# Patient Record
Sex: Female | Born: 1955 | Hispanic: No | Marital: Single
Health system: Midwestern US, Community
[De-identification: ages and names within clinical notes are randomized; demographics above are authoritative.]

## PROBLEM LIST (undated history)

## (undated) DIAGNOSIS — G894 Chronic pain syndrome: Secondary | ICD-10-CM

## (undated) DIAGNOSIS — T402X5A Adverse effect of other opioids, initial encounter: Secondary | ICD-10-CM

## (undated) DIAGNOSIS — I10 Essential (primary) hypertension: Secondary | ICD-10-CM

## (undated) DIAGNOSIS — E118 Type 2 diabetes mellitus with unspecified complications: Principal | ICD-10-CM

## (undated) DIAGNOSIS — K5903 Drug induced constipation: Secondary | ICD-10-CM

## (undated) DIAGNOSIS — M25551 Pain in right hip: Secondary | ICD-10-CM

## (undated) DIAGNOSIS — M25552 Pain in left hip: Secondary | ICD-10-CM

## (undated) DIAGNOSIS — D509 Iron deficiency anemia, unspecified: Secondary | ICD-10-CM

## (undated) DIAGNOSIS — E782 Mixed hyperlipidemia: Secondary | ICD-10-CM

## (undated) DIAGNOSIS — Z1231 Encounter for screening mammogram for malignant neoplasm of breast: Secondary | ICD-10-CM

## (undated) DIAGNOSIS — M160 Bilateral primary osteoarthritis of hip: Secondary | ICD-10-CM

## (undated) DIAGNOSIS — M1990 Unspecified osteoarthritis, unspecified site: Secondary | ICD-10-CM

## (undated) DIAGNOSIS — F4312 Post-traumatic stress disorder, chronic: Secondary | ICD-10-CM

## (undated) DIAGNOSIS — E1165 Type 2 diabetes mellitus with hyperglycemia: Secondary | ICD-10-CM

## (undated) HISTORY — PX: SKIN GRAFT: SHX250

## (undated) HISTORY — PX: OTHER SURGICAL HISTORY: SHX169

## (undated) HISTORY — PX: TUBAL LIGATION: SHX77

## (undated) HISTORY — PX: KNEE SURGERY: SHX244

---

## 2014-09-19 DIAGNOSIS — Z96653 Presence of artificial knee joint, bilateral: Secondary | ICD-10-CM | POA: Insufficient documentation

## 2014-09-19 DIAGNOSIS — I890 Lymphedema, not elsewhere classified: Secondary | ICD-10-CM | POA: Insufficient documentation

## 2014-09-19 DIAGNOSIS — F4312 Post-traumatic stress disorder, chronic: Secondary | ICD-10-CM

## 2014-09-19 HISTORY — DX: Post-traumatic stress disorder, chronic: F43.12

## 2015-01-05 DIAGNOSIS — E119 Type 2 diabetes mellitus without complications: Secondary | ICD-10-CM | POA: Diagnosis not present

## 2015-01-05 DIAGNOSIS — I10 Essential (primary) hypertension: Secondary | ICD-10-CM | POA: Diagnosis not present

## 2015-02-02 DIAGNOSIS — J309 Allergic rhinitis, unspecified: Secondary | ICD-10-CM | POA: Diagnosis not present

## 2015-02-02 DIAGNOSIS — H543 Unqualified visual loss, both eyes: Secondary | ICD-10-CM | POA: Diagnosis not present

## 2015-02-02 DIAGNOSIS — I10 Essential (primary) hypertension: Secondary | ICD-10-CM | POA: Diagnosis not present

## 2015-02-02 DIAGNOSIS — E119 Type 2 diabetes mellitus without complications: Secondary | ICD-10-CM | POA: Diagnosis not present

## 2015-04-06 DIAGNOSIS — I10 Essential (primary) hypertension: Secondary | ICD-10-CM | POA: Diagnosis not present

## 2015-04-06 DIAGNOSIS — N959 Unspecified menopausal and perimenopausal disorder: Secondary | ICD-10-CM | POA: Diagnosis not present

## 2015-04-06 DIAGNOSIS — L2089 Other atopic dermatitis: Secondary | ICD-10-CM | POA: Diagnosis not present

## 2015-04-06 DIAGNOSIS — IMO0002 Reserved for concepts with insufficient information to code with codable children: Secondary | ICD-10-CM

## 2015-04-06 DIAGNOSIS — E1165 Type 2 diabetes mellitus with hyperglycemia: Secondary | ICD-10-CM

## 2015-04-06 DIAGNOSIS — H543 Unqualified visual loss, both eyes: Secondary | ICD-10-CM | POA: Diagnosis not present

## 2015-04-06 DIAGNOSIS — I890 Lymphedema, not elsewhere classified: Secondary | ICD-10-CM | POA: Diagnosis not present

## 2015-04-06 DIAGNOSIS — J309 Allergic rhinitis, unspecified: Secondary | ICD-10-CM | POA: Diagnosis not present

## 2015-04-06 DIAGNOSIS — Z6841 Body Mass Index (BMI) 40.0 and over, adult: Secondary | ICD-10-CM | POA: Diagnosis not present

## 2015-04-06 DIAGNOSIS — Z1231 Encounter for screening mammogram for malignant neoplasm of breast: Secondary | ICD-10-CM | POA: Diagnosis not present

## 2015-04-06 DIAGNOSIS — R05 Cough: Secondary | ICD-10-CM | POA: Diagnosis not present

## 2015-04-06 DIAGNOSIS — Z1239 Encounter for other screening for malignant neoplasm of breast: Secondary | ICD-10-CM | POA: Diagnosis not present

## 2015-04-06 DIAGNOSIS — F418 Other specified anxiety disorders: Secondary | ICD-10-CM | POA: Diagnosis not present

## 2015-04-06 HISTORY — DX: Reserved for concepts with insufficient information to code with codable children: IMO0002

## 2015-04-06 HISTORY — DX: Type 2 diabetes mellitus with hyperglycemia: E11.65

## 2015-05-11 DIAGNOSIS — Z1231 Encounter for screening mammogram for malignant neoplasm of breast: Secondary | ICD-10-CM | POA: Diagnosis not present

## 2015-05-11 DIAGNOSIS — Z1239 Encounter for other screening for malignant neoplasm of breast: Secondary | ICD-10-CM | POA: Diagnosis not present

## 2015-06-29 DIAGNOSIS — E119 Type 2 diabetes mellitus without complications: Secondary | ICD-10-CM | POA: Diagnosis not present

## 2015-09-15 DIAGNOSIS — Z6841 Body Mass Index (BMI) 40.0 and over, adult: Secondary | ICD-10-CM | POA: Diagnosis not present

## 2015-09-15 DIAGNOSIS — R200 Anesthesia of skin: Secondary | ICD-10-CM | POA: Diagnosis not present

## 2015-09-15 DIAGNOSIS — R05 Cough: Secondary | ICD-10-CM | POA: Diagnosis not present

## 2015-09-15 DIAGNOSIS — Z23 Encounter for immunization: Secondary | ICD-10-CM | POA: Diagnosis not present

## 2015-09-15 DIAGNOSIS — R202 Paresthesia of skin: Secondary | ICD-10-CM | POA: Diagnosis not present

## 2015-09-15 DIAGNOSIS — J0100 Acute maxillary sinusitis, unspecified: Secondary | ICD-10-CM | POA: Diagnosis not present

## 2015-09-15 DIAGNOSIS — E1165 Type 2 diabetes mellitus with hyperglycemia: Secondary | ICD-10-CM | POA: Diagnosis not present

## 2015-09-18 DIAGNOSIS — D509 Iron deficiency anemia, unspecified: Secondary | ICD-10-CM

## 2015-09-18 DIAGNOSIS — E1165 Type 2 diabetes mellitus with hyperglycemia: Secondary | ICD-10-CM | POA: Diagnosis not present

## 2015-09-18 DIAGNOSIS — R200 Anesthesia of skin: Secondary | ICD-10-CM | POA: Diagnosis not present

## 2015-09-18 DIAGNOSIS — R202 Paresthesia of skin: Secondary | ICD-10-CM | POA: Diagnosis not present

## 2015-09-18 HISTORY — DX: Iron deficiency anemia, unspecified: D50.9

## 2015-10-19 DIAGNOSIS — D509 Iron deficiency anemia, unspecified: Secondary | ICD-10-CM | POA: Diagnosis not present

## 2015-10-19 DIAGNOSIS — N959 Unspecified menopausal and perimenopausal disorder: Secondary | ICD-10-CM | POA: Diagnosis not present

## 2015-12-26 DIAGNOSIS — E119 Type 2 diabetes mellitus without complications: Secondary | ICD-10-CM | POA: Diagnosis not present

## 2015-12-26 DIAGNOSIS — D259 Leiomyoma of uterus, unspecified: Secondary | ICD-10-CM | POA: Diagnosis not present

## 2015-12-26 DIAGNOSIS — I10 Essential (primary) hypertension: Secondary | ICD-10-CM | POA: Diagnosis not present

## 2015-12-26 DIAGNOSIS — R61 Generalized hyperhidrosis: Secondary | ICD-10-CM | POA: Diagnosis not present

## 2015-12-26 DIAGNOSIS — N939 Abnormal uterine and vaginal bleeding, unspecified: Secondary | ICD-10-CM | POA: Diagnosis not present

## 2015-12-26 DIAGNOSIS — Z7984 Long term (current) use of oral hypoglycemic drugs: Secondary | ICD-10-CM | POA: Diagnosis not present

## 2015-12-26 DIAGNOSIS — Z6841 Body Mass Index (BMI) 40.0 and over, adult: Secondary | ICD-10-CM | POA: Diagnosis not present

## 2015-12-26 DIAGNOSIS — Z79899 Other long term (current) drug therapy: Secondary | ICD-10-CM | POA: Diagnosis not present

## 2015-12-26 DIAGNOSIS — N840 Polyp of corpus uteri: Secondary | ICD-10-CM | POA: Diagnosis not present

## 2015-12-26 DIAGNOSIS — E669 Obesity, unspecified: Secondary | ICD-10-CM | POA: Diagnosis not present

## 2016-02-19 DIAGNOSIS — G8929 Other chronic pain: Secondary | ICD-10-CM | POA: Diagnosis not present

## 2016-02-19 DIAGNOSIS — M545 Low back pain: Secondary | ICD-10-CM | POA: Diagnosis not present

## 2016-02-19 DIAGNOSIS — E119 Type 2 diabetes mellitus without complications: Secondary | ICD-10-CM | POA: Diagnosis not present

## 2016-04-18 DIAGNOSIS — D250 Submucous leiomyoma of uterus: Secondary | ICD-10-CM | POA: Diagnosis not present

## 2016-04-18 DIAGNOSIS — Z1211 Encounter for screening for malignant neoplasm of colon: Secondary | ICD-10-CM | POA: Diagnosis not present

## 2016-04-18 DIAGNOSIS — M25552 Pain in left hip: Secondary | ICD-10-CM | POA: Diagnosis not present

## 2016-04-18 DIAGNOSIS — Z6841 Body Mass Index (BMI) 40.0 and over, adult: Secondary | ICD-10-CM | POA: Diagnosis not present

## 2016-04-18 DIAGNOSIS — E1165 Type 2 diabetes mellitus with hyperglycemia: Secondary | ICD-10-CM | POA: Diagnosis not present

## 2016-04-18 DIAGNOSIS — D509 Iron deficiency anemia, unspecified: Secondary | ICD-10-CM | POA: Diagnosis not present

## 2016-04-18 DIAGNOSIS — Z23 Encounter for immunization: Secondary | ICD-10-CM | POA: Diagnosis not present

## 2016-04-18 DIAGNOSIS — I10 Essential (primary) hypertension: Secondary | ICD-10-CM | POA: Diagnosis not present

## 2016-04-18 DIAGNOSIS — Z09 Encounter for follow-up examination after completed treatment for conditions other than malignant neoplasm: Secondary | ICD-10-CM | POA: Diagnosis not present

## 2016-04-18 DIAGNOSIS — I890 Lymphedema, not elsewhere classified: Secondary | ICD-10-CM | POA: Diagnosis not present

## 2016-04-18 DIAGNOSIS — R05 Cough: Secondary | ICD-10-CM | POA: Diagnosis not present

## 2016-05-23 ENCOUNTER — Ambulatory Visit (HOSPITAL_COMMUNITY)
Admission: EM | Admit: 2016-05-23 | Discharge: 2016-05-23 | Disposition: A | Discharge status: Home / Self Care | Payer: Medicare Other | Attending: Family Medicine | Admitting: Family Medicine

## 2016-05-23 ENCOUNTER — Encounter (HOSPITAL_COMMUNITY): Payer: Self-pay | Admitting: *Deleted

## 2016-05-23 DIAGNOSIS — M25552 Pain in left hip: Secondary | ICD-10-CM

## 2016-05-23 HISTORY — DX: Essential (primary) hypertension: I10

## 2016-05-23 HISTORY — DX: Unspecified osteoarthritis, unspecified site: M19.90

## 2016-05-23 MED ORDER — NAPROXEN 500 MG PO TABS: 500.0000 mg | ORAL_TABLET | Freq: Two times a day (BID) | ORAL | Status: DC

## 2016-05-23 NOTE — ED Provider Notes (Signed)
CSN: [id number]     Arrival date & time 05/23/16  1014 History   First MD Initiated Contact with Patient 05/23/16 1106     Chief Complaint  Patient presents with  . Hip Pain   (Consider location/radiation/quality/duration/timing/severity/associated sxs/prior Treatment) HPI  Past Medical History  Diagnosis Date  . Diabetes mellitus without complication ([place])   . Hypertension   . Arthritis    Past Surgical History  Procedure Laterality Date  . Knee surgery    . Btl    . Skin graft     Family History  Problem Relation Age of Onset  . Diabetes Other    Social History  Substance Use Topics  . Smoking status: Never Smoker   . Smokeless tobacco: None  . Alcohol Use: No   OB History    No data available     Review of Systems History obtained from patient:  Pt presents with the cc of:  Left hip pain Duration of symptoms: 3-4 days Treatment prior to arrival: Naprosyn Context: Patient has been moving from [facility] to [place] and is been doing a lot of walking and moving items now has hip pain Other symptoms include: Some pain radiating down her leg Pain score: 4 FAMILY HISTORY: Hypertension    Allergies  Morphine and related  Home Medications   Prior to Admission medications   Medication Sig Start Date End Date Taking? Authorizing Provider  naproxen (NAPROSYN) 500 MG tablet Take 1 tablet (500 mg total) by mouth 2 (two) times daily. 05/23/16   Konrad Felix, PA   Meds Ordered and Administered this Visit  Medications - No data to display  BP 139/84 mmHg  Pulse 78  Temp(Src) 98.6 F (37 C) (Oral)  Resp 18  SpO2 100% No data found.   Physical Exam NURSES NOTES AND VITAL SIGNS REVIEWED. CONSTITUTIONAL: Well developed, well nourished, no acute distress HEENT: normocephalic, atraumatic EYES: Conjunctiva normal NECK:normal ROM, supple, no adenopathy PULMONARY:No respiratory distress, normal effort ABDOMINAL: Soft, ND, NT BS+, No CVAT MUSCULOSKELETAL: Normal  ROM of all extremities, left hip and groin minimally tender. There is no specific point tenderness in the area. Flexion and extension of the right hip is intact.  SKIN: warm and dry without rash PSYCHIATRIC: Mood and affect, behavior are normal  ED Course  Procedures (including critical care time)  Labs Review Labs Reviewed - No data to display  Imaging Review No results found.   Visual Acuity Review  Right Eye Distance:   Left Eye Distance:   Bilateral Distance:    Right Eye Near:   Left Eye Near:    Bilateral Near:      Prescription for Naprosyn is provided to the patient.fcpl    MDM   1. Hip pain, acute, left     Patient is reassured that there are no issues that require transfer to higher level of care at this time or additional tests. Patient is advised to continue home symptomatic treatment. Patient is advised that if there are new or worsening symptoms to attend the emergency department, contact primary care provider, or return to UC. Instructions of care provided discharged home in stable condition.    THIS NOTE WAS GENERATED USING A VOICE RECOGNITION SOFTWARE PROGRAM. ALL REASONABLE EFFORTS  WERE MADE TO PROOFREAD THIS DOCUMENT FOR ACCURACY.  I have verbally reviewed the discharge instructions with the patient. A printed AVS was given to the patient.  All questions were answered prior to discharge.      Pilar Plate  Dillard Essex, [place] 05/23/16 1731

## 2016-05-23 NOTE — Discharge Instructions (Signed)
Heat Therapy Heat therapy can help ease sore, stiff, injured, and tight muscles and joints. Heat relaxes your muscles, which may help ease your pain.  RISKS AND COMPLICATIONS If you have any of the following conditions, do not use heat therapy unless your health care provider has approved:  Poor circulation.  Healing wounds or scarred skin in the area being treated.  Diabetes, heart disease, or high blood pressure.  Not being able to feel (numbness) the area being treated.  Unusual swelling of the area being treated.  Active infections.  Blood clots.  Cancer.  Inability to communicate pain. This may include young children and people who have problems with their brain function (dementia).  Pregnancy. Heat therapy should only be used on old, pre-existing, or long-lasting (chronic) injuries. Do not use heat therapy on new injuries unless directed by your health care provider. HOW TO USE HEAT THERAPY There are several different kinds of heat therapy, including:  Moist heat pack.  Warm water bath.  Hot water bottle.  Electric heating pad.  Heated gel pack.  Heated wrap.  Electric heating pad. Use the heat therapy method suggested by your health care provider. Follow your health care provider's instructions on when and how to use heat therapy. GENERAL HEAT THERAPY RECOMMENDATIONS  Do not sleep while using heat therapy. Only use heat therapy while you are awake.  Your skin may turn pink while using heat therapy. Do not use heat therapy if your skin turns red.  Do not use heat therapy if you have new pain.  High heat or long exposure to heat can cause burns. Be careful when using heat therapy to avoid burning your skin.  Do not use heat therapy on areas of your skin that are already irritated, such as with a rash or sunburn. SEEK MEDICAL CARE IF:  You have blisters, redness, swelling, or numbness.  You have new pain.  Your pain is worse. MAKE SURE  YOU:  Understand these instructions.  Will watch your condition.  Will get help right away if you are not doing well or get worse.   This information is not intended to replace advice given to you by your health care provider. Make sure you discuss any questions you have with your health care provider.   Document Released: 01/20/2012 Document Revised: 11/18/2014 Document Reviewed: 12/21/2013 Elsevier Interactive Patient Education 2016 Elsevier [employer] Hip Pain Your hip is the joint between your upper legs and your lower pelvis. The bones, cartilage, tendons, and muscles of your hip joint perform a lot of work each day supporting your body weight and allowing you to move around. Hip pain can range from a minor ache to severe pain in one or both of your hips. Pain may be felt on the inside of the hip joint near the groin, or the outside near the buttocks and upper thigh. You may have swelling or stiffness as well.  HOME CARE INSTRUCTIONS   Take medicines only as directed by your health care provider.  Apply ice to the injured area:  Put ice in a plastic bag.  Place a towel between your skin and the bag.  Leave the ice on for 15-20 minutes at a time, 3-4 times a day.  Keep your leg raised (elevated) when possible to lessen swelling.  Avoid activities that cause pain.  Follow specific exercises as directed by your health care provider.  Sleep with a pillow between your legs on your most comfortable side.  Record how often you  have hip pain, the location of the pain, and what it feels like. SEEK MEDICAL CARE IF:   You are unable to put weight on your leg.  Your hip is red or swollen or very tender to touch.  Your pain or swelling continues or worsens after 1 week.  You have increasing difficulty walking.  You have a fever. SEEK IMMEDIATE MEDICAL CARE IF:   You have fallen.  You have a sudden increase in pain and swelling in your hip. MAKE SURE YOU:   Understand these  instructions.  Will watch your condition.  Will get help right away if you are not doing well or get worse.   This information is not intended to replace advice given to you by your health care provider. Make sure you discuss any questions you have with your health care provider.   Document Released: 04/17/2010 Document Revised: 11/18/2014 Document Reviewed: 06/24/2013 Elsevier Interactive Patient Education [employer].

## 2016-05-23 NOTE — ED Notes (Signed)
Pt  Reports pain  l  Hip  radiating  Down  The l  Leg    Since yesterday  She  denys  Any  specefic  Injury    She  Reports  Pain is  Worse  On  cetai  Movements  And  On  Weight  Bearing

## 2016-07-03 ENCOUNTER — Encounter (HOSPITAL_COMMUNITY): Payer: Self-pay | Admitting: Neurology

## 2016-07-03 ENCOUNTER — Emergency Department (HOSPITAL_COMMUNITY): Payer: Medicare Other

## 2016-07-03 ENCOUNTER — Emergency Department (HOSPITAL_COMMUNITY)
Admission: EM | Admit: 2016-07-03 | Discharge: 2016-07-03 | Disposition: A | Discharge status: Home / Self Care | Payer: Medicare Other | Attending: Emergency Medicine | Admitting: Emergency Medicine

## 2016-07-03 DIAGNOSIS — I10 Essential (primary) hypertension: Secondary | ICD-10-CM | POA: Insufficient documentation

## 2016-07-03 DIAGNOSIS — E119 Type 2 diabetes mellitus without complications: Secondary | ICD-10-CM | POA: Diagnosis not present

## 2016-07-03 DIAGNOSIS — M160 Bilateral primary osteoarthritis of hip: Secondary | ICD-10-CM

## 2016-07-03 DIAGNOSIS — M1611 Unilateral primary osteoarthritis, right hip: Secondary | ICD-10-CM | POA: Diagnosis not present

## 2016-07-03 DIAGNOSIS — M1612 Unilateral primary osteoarthritis, left hip: Secondary | ICD-10-CM | POA: Diagnosis not present

## 2016-07-03 DIAGNOSIS — M25551 Pain in right hip: Secondary | ICD-10-CM | POA: Diagnosis not present

## 2016-07-03 MED ORDER — ACETAMINOPHEN 500 MG PO TABS: 500.0000 mg | ORAL_TABLET | Freq: Four times a day (QID) | ORAL | 0 refills | Status: DC | PRN

## 2016-07-03 MED ORDER — NAPROXEN 500 MG PO TABS: 500.0000 mg | ORAL_TABLET | Freq: Two times a day (BID) | ORAL | 0 refills | Status: DC

## 2016-07-03 NOTE — ED Provider Notes (Signed)
[facility] DEPT Provider Note   CSN: [id number] Arrival date & time: 07/03/16  1522  By signing my name below, I, Hayley Alvarado, attest that this documentation has been prepared under the direction and in the presence of Gloriann Loan, PA-C. Electronically Signed: Tedra Coupe. Sheppard Coil, ED Scribe. 07/03/16. 5:19 PM.  History   Chief Complaint Chief Complaint  Patient presents with  . Hip Pain    HPI HPI Comments: Hayley Alvarado is a 60 y.o. female with PMHx of arthritis, DM, and HTN who presents to the Emergency Department complaining of sudden onset, acute on chronic, constant, sharp, radiating right-sided hip pain to right knee x 1 day. She states that she has had left-hip pain in the past but now the right hip is more severe. She denies any recent fall or injury. Pt has taken Naproxen, applying ice packs and warm compresses to hip with no relief. Pain is exacerbated with applying pressure and bearing weight.  Denies any fever, numbness, back pain, or abdominal pain.  The history is provided by the patient. No language interpreter was used.    Past Medical History:  Diagnosis Date  . Arthritis   . Diabetes mellitus without complication ([place])   . Hypertension     There are no active problems to display for this patient.   Past Surgical History:  Procedure Laterality Date  . btl    . KNEE SURGERY    . SKIN GRAFT      OB History    No data available       Home Medications    Prior to Admission medications   Medication Sig Start Date End Date Taking? Authorizing Provider  acetaminophen (TYLENOL) 500 MG tablet Take 1 tablet (500 mg total) by mouth every 6 (six) hours as needed. 07/03/16   Gloriann Loan, PA-C  naproxen (NAPROSYN) 500 MG tablet Take 1 tablet (500 mg total) by mouth 2 (two) times daily. 07/03/16   Gloriann Loan, PA-C    Family History Family History  Problem Relation Age of Onset  . Diabetes Other     Social History Social History  Substance Use  Topics  . Smoking status: Never Smoker  . Smokeless tobacco: Not on file  . Alcohol use No     Allergies   Morphine and related   Review of Systems Review of Systems  Constitutional: Negative for fever.  Gastrointestinal: Negative for abdominal pain.  Musculoskeletal: Positive for arthralgias. Negative for back pain.  Neurological: Negative for numbness.  All other systems reviewed and are negative.  Physical Exm Updated Vital Signs BP 150/80 (BP Location: Left Arm)   Pulse 98   Temp 98.5 F (36.9 C) (Oral)   Resp 20   Wt 113.4 kg   SpO2 96%   Physical Exam  Constitutional: She is oriented to person, place, and time. She appears well-developed and well-nourished.  Non-toxic appearance. She does not have a sickly appearance. She does not appear ill.  HENT:  Head: Normocephalic and atraumatic.  Mouth/Throat: Oropharynx is clear and moist.  Eyes: Conjunctivae are normal.  Neck: Normal range of motion. Neck supple.  Cardiovascular: Normal rate and regular rhythm.   Pulmonary/Chest: Effort normal and breath sounds normal. No accessory muscle usage or stridor. No respiratory distress. She has no wheezes. She has no rhonchi. She has no rales.  Abdominal: Soft. Bowel sounds are normal. She exhibits no distension. There is no tenderness.  Musculoskeletal: She exhibits tenderness.       Right hip:  She exhibits decreased range of motion, decreased strength, tenderness and bony tenderness.       Left hip: Normal.  Diffuse tenderness over right hip.  No swelling, warmth, deformity.  Decreased ROM due to pain.   Lymphadenopathy:    She has no cervical adenopathy.  Neurological: She is alert and oriented to person, place, and time.  Right hip with decreased strength secondary to pain.  Normal sensation.  Skin: Skin is warm and dry.  Psychiatric: She has a normal mood and affect. Her behavior is normal.     ED Treatments / Results  DIAGNOSTIC STUDIES: Oxygen Saturation is 96% on  RA, normal by my interpretation.    COORDINATION OF CARE: 5:18 PM-Discussed treatment plan which includes continuation of Naproxen, order of Extra Strength Tylenol and follow-up with Ortho with pt at bedside and pt agreed to plan.   Radiology Dg Hip Unilat  With Pelvis 2-3 Views Right  Result Date: 07/03/2016 CLINICAL DATA:  Hervey Ard right hip pain radiating to the right thigh since yesterday, no injury EXAM: DG HIP (WITH OR WITHOUT PELVIS) 2-3V RIGHT COMPARISON:  None. FINDINGS: There is age advanced degenerative joint disease involving both hips. There is loss of joint space with sclerosis, spurring, and subchondral cyst formation present. No acute fracture is seen. The pelvic rami are intact. The SI joints are corticated. IMPRESSION: Age advanced degenerative joint disease of both hips. No acute abnormality. Electronically Signed   By: Ivar Drape M.D.   On: 07/03/2016 17:10   Procedures Procedures (including critical care time)   Initial Impression / Assessment and Plan / ED Course  I have reviewed the triage vital signs and the nursing notes.  Pertinent labs & imaging results that were available during my care of the patient were reviewed by me and considered in my medical decision making (see chart for details).  Clinical Course   Patient X-Ray negative for obvious fracture or dislocation, does show advanced degenerative joint disease of b/l hips.  Pt advised to follow up with orthopedics.  Conservative therapy recommended and discussed. Patient will be discharged home & is agreeable with above plan. Returns precautions discussed. Pt appears safe for discharge.   Final Clinical Impressions(s) / ED Diagnoses   Final diagnoses:  Right hip pain  Osteoarthritis of both hips, unspecified osteoarthritis type    New Prescriptions New Prescriptions   ACETAMINOPHEN (TYLENOL) 500 MG TABLET    Take 1 tablet (500 mg total) by mouth every 6 (six) hours as needed.   NAPROXEN (NAPROSYN) 500 MG  TABLET    Take 1 tablet (500 mg total) by mouth 2 (two) times daily.   I personally performed the services described in this documentation, which was scribed in my presence. The recorded information has been reviewed and is accurate.     Gloriann Loan, PA-C 07/03/16 1730    Drenda Freeze, MD 07/06/16 2136

## 2016-07-03 NOTE — ED Notes (Signed)
Pt is in stable condition upon d/c and is escorted from ED via wheelchair. 

## 2016-07-03 NOTE — ED Triage Notes (Signed)
Pt reports right sided hip pain since yesterday. Has been taking naproxen without relief. Denies fall or injury. Pain when bearing wt.

## 2017-01-31 ENCOUNTER — Ambulatory Visit (INDEPENDENT_AMBULATORY_CARE_PROVIDER_SITE_OTHER): Payer: Medicare Other | Admitting: Family Medicine

## 2017-01-31 ENCOUNTER — Encounter: Payer: Self-pay | Admitting: Family Medicine

## 2017-01-31 VITALS — BP 156/80 | HR 111 | Temp 98.9°F | Resp 18 | Ht 64.0 in | Wt 263.0 lb

## 2017-01-31 DIAGNOSIS — I890 Lymphedema, not elsewhere classified: Secondary | ICD-10-CM

## 2017-01-31 DIAGNOSIS — R7309 Other abnormal glucose: Secondary | ICD-10-CM

## 2017-01-31 DIAGNOSIS — M25552 Pain in left hip: Secondary | ICD-10-CM | POA: Diagnosis not present

## 2017-01-31 DIAGNOSIS — I10 Essential (primary) hypertension: Secondary | ICD-10-CM

## 2017-01-31 DIAGNOSIS — Z23 Encounter for immunization: Secondary | ICD-10-CM | POA: Diagnosis not present

## 2017-01-31 DIAGNOSIS — Z862 Personal history of diseases of the blood and blood-forming organs and certain disorders involving the immune mechanism: Secondary | ICD-10-CM

## 2017-01-31 DIAGNOSIS — H539 Unspecified visual disturbance: Secondary | ICD-10-CM

## 2017-01-31 DIAGNOSIS — M25551 Pain in right hip: Secondary | ICD-10-CM

## 2017-01-31 LAB — CBC WITH DIFFERENTIAL/PLATELET
Basophils Absolute: 0 cells/uL (ref 0–200)
Basophils Relative: 0 %
EOS PCT: 2 %
Eosinophils Absolute: 182 cells/uL (ref 15–500)
HCT: 40.4 % (ref 35.0–45.0)
Hemoglobin: 12.7 g/dL (ref 11.7–15.5)
LYMPHS PCT: 28 %
Lymphs Abs: 2548 cells/uL (ref 850–3900)
MCH: 22.8 pg — AB (ref 27.0–33.0)
MCHC: 31.4 g/dL — ABNORMAL LOW (ref 32.0–36.0)
MCV: 72.5 fL — ABNORMAL LOW (ref 80.0–100.0)
MONOS PCT: 7 %
MPV: 11.1 fL (ref 7.5–12.5)
Monocytes Absolute: 637 cells/uL (ref 200–950)
NEUTROS ABS: 5733 {cells}/uL (ref 1500–7800)
Neutrophils Relative %: 63 %
PLATELETS: 264 10*3/uL (ref 140–400)
RBC: 5.57 MIL/uL — ABNORMAL HIGH (ref 3.80–5.10)
RDW: 16.4 % — AB (ref 11.0–15.0)
WBC: 9.1 10*3/uL (ref 3.8–10.8)

## 2017-01-31 LAB — COMPREHENSIVE METABOLIC PANEL
ALT: 10 U/L (ref 6–29)
AST: 13 U/L (ref 10–35)
Albumin: 4.5 g/dL (ref 3.6–5.1)
Alkaline Phosphatase: 97 U/L (ref 33–130)
BUN: 15 mg/dL (ref 7–25)
CO2: 29 mmol/L (ref 20–31)
CREATININE: 0.85 mg/dL (ref 0.50–1.05)
Calcium: 9.3 mg/dL (ref 8.6–10.4)
Chloride: 101 mmol/L (ref 98–110)
GLUCOSE: 120 mg/dL — AB (ref 65–99)
POTASSIUM: 3.7 mmol/L (ref 3.5–5.3)
SODIUM: 139 mmol/L (ref 135–146)
TOTAL PROTEIN: 7.5 g/dL (ref 6.1–8.1)
Total Bilirubin: 0.5 mg/dL (ref 0.2–1.2)

## 2017-01-31 MED ORDER — PREDNISONE 20 MG PO TABS
ORAL_TABLET | ORAL | 0 refills | Status: DC
Start: 2017-01-31 — End: 2018-02-10

## 2017-01-31 MED ORDER — GABAPENTIN 400 MG PO CAPS: 400.0000 mg | ORAL_CAPSULE | Freq: Four times a day (QID) | ORAL | 1 refills | Status: DC

## 2017-01-31 MED ORDER — KETOROLAC TROMETHAMINE 60 MG/2ML IM SOLN
60.0000 mg | Freq: Once | INTRAMUSCULAR | Status: AC
  Administered 2017-01-31: 60 mg via INTRAMUSCULAR

## 2017-01-31 MED ORDER — IPRATROPIUM BROMIDE 0.03 % NA SOLN: 2.0000 | Freq: Two times a day (BID) | NASAL | 1 refills | Status: DC | PRN

## 2017-01-31 MED ORDER — HYDROCHLOROTHIAZIDE 25 MG PO TABS: 25.0000 mg | ORAL_TABLET | Freq: Every day | ORAL | 1 refills | Status: DC

## 2017-01-31 MED ORDER — MELOXICAM 15 MG PO TABS: 15.0000 mg | ORAL_TABLET | Freq: Every day | ORAL | 0 refills | Status: DC

## 2017-01-31 NOTE — Patient Instructions (Signed)
Hip Pain  Take Prednisone 20 mg,  in mornings with breakfast as follows:  Take 3 pills for 3 days, Take 2 pills for 3 days, and Take 1 pill for 3 days.  Complete all medication.  Do not start Meloxicam 15 mg until you complete coarse of prednisone.  Start Gabapentin 400 mg, up to 4 times daily as needed for sciatica pain.   For Post Nasal drip-  Start Cetirizine (Zyrtec) 10 mg at bedtime.  Ipratropium (Atrovent) 2 sprays, twice daily as needed for congestion.

## 2017-01-31 NOTE — Progress Notes (Addendum)
Patient ID: Hayley Alvarado, female    DOB: 1956/03/01, 61 y.o.   MRN: [id number]  PCP: No PCP Per Patient  Chief Complaint  Patient presents with  . Hip Pain    BOTH  . Establish Care  . paper for handicap placard/ SCAT transport    Subjective:  HPI  Hayley Alvarado is a 61 y.o. female presents to establish care, chronic bilateral hip pain, and request forms completed for SCAT and [place] Handicap Placard. Past medical history hypertension, lymphedema, elevated A1C, and chronic hip pain. Recently relocated to [facility] with daughter who moved here to accept a job.  Hip Pain  Gerri recently pain has caused her to become nearly disabled. She has to obtain help from family to complete ADL's. She is not completing homebound, although on some days, she is unable to leave her 2nd floor apartment without assistance.  She characterized hip pain as constant aching with sharp pains occurring with standing and walking. She reports her left hip is worst than right, although both legs are painful. She reports recently experiencing left sharp toe pains. She denies any recent or prior falls. Reports no hx of direct injury to hips or known diagnosis of osteoarthritis.  Prior imaging taken in 06/2016 showed degenerative joint disease. Due to this chronic, disabling pain, patient requests forms completed to aid her with public transportation and a handicap placard.  Visual changes  Two weeks ago Makeya reports acute onset of noticing what appeared to be floaters in his eyes bilaterally. She reports an extended period of time since she has received an eye exam and has noticed recently diminished vision.  Chronic Post Nasal Drip Sensation of dripping in the back of throat persistently for an extended period of time. This occurs irregardless of seasons. It has become annoying and irritating. Denies wheezing or shortness of breath.    Social History   Social History  . Marital status: Single    Spouse  name: N/A  . Number of children: N/A  . Years of education: N/A   Occupational History  . Not on file.   Social History Main Topics  . Smoking status: Never Smoker  . Smokeless tobacco: Never Used  . Alcohol use No  . Drug use: No  . Sexual activity: Not on file   Other Topics Concern  . Not on file   Social History Narrative  . No narrative on file    Family History  Problem Relation Age of Onset  . Diabetes Other      Review of Systems See HPI  Prior to Admission medications   Medication Sig Start Date End Date Taking? Authorizing Provider  acetaminophen (TYLENOL) 500 MG tablet Take 1 tablet (500 mg total) by mouth every 6 (six) hours as needed. 07/03/16  Yes Kayla Rose, PA-C  naproxen (NAPROSYN) 500 MG tablet Take 1 tablet (500 mg total) by mouth 2 (two) times daily. Patient not taking: Reported on 01/31/2017 07/03/16   Gloriann Loan, PA-C    Past Medical, Surgical Family and Social History reviewed and updated.    Objective:   Today's Vitals   01/31/17 1403  BP: (!) 156/80  Pulse: (!) 111  Resp: 18  Temp: 98.9 F (37.2 C)  TempSrc: Oral  SpO2: 98%  Weight: 263 lb (119.3 kg)  Height: 5\' 4"  (1.626 m)    Wt Readings from Last 3 Encounters:  01/31/17 263 lb (119.3 kg)  07/03/16 250 lb (113.4 kg)    Physical Exam  Constitutional:  She is oriented to person, place, and time. She appears well-developed and well-nourished.  HENT:  Head: Normocephalic and atraumatic.  Nose: Rhinorrhea present.  Eyes: Conjunctivae and EOM are normal. Pupils are equal, round, and reactive to light.  Neck: Normal range of motion. Neck supple.  Cardiovascular: Normal rate, regular rhythm, normal heart sounds and intact distal pulses.   Pulmonary/Chest: Effort normal and breath sounds normal.  Musculoskeletal: She exhibits edema, tenderness and deformity.  Grossly edematous non-pitting lower legs. Edema is mostly visible lymphedema with excessively dry and scaly skin patches.   Lymphadenopathy:    She has no cervical adenopathy.  Neurological: She is alert and oriented to person, place, and time.  Skin: Skin is warm and dry.  Psychiatric: She has a normal mood and affect. Her behavior is normal. Judgment and thought content normal.     Assessment & Plan:  1. Bilateral hip pain - ketorolac (TORADOL) injection 60 mg; Inject 2 mLs (60 mg total) into the muscle once. - For home use only DME 4 wheeled rolling walker with seat ([id number]) - For home use only DME Eelevated commode seat - Ambulatory referral to Orthopedic Surgery -Start gabapentin 400 mg four times daily as needed for pain. -Start prednisone taper and take as prescribed. -Take Meloxicam 15 mg once daily for pain after you complete prednisone   2. Essential hypertension - Comprehensive metabolic panel -Continue HCTZ 25 mg daily  3. Need for diphtheria-tetanus-pertussis (Tdap) vaccine - Tdap vaccine greater than or equal to 7yo IM  4. Need for influenza vaccination - Flu Vaccine QUAD 36+ mos IM  5. Visual changes  6. Elevated hemoglobin A1c - Hemoglobin A1c  7. Hx of iron deficiency anemia - CBC with Differential/Platelet  8. Lymphedema -Ambulate legs. Avoid prolong sitting without legs ambulated.  Carroll Sage. Kenton Kingfisher, MSN, [facility] [address] [place], [place] [address] [phone]

## 2017-02-01 LAB — HEMOGLOBIN A1C
HEMOGLOBIN A1C: 7 % — AB (ref <5.7)
MEAN PLASMA GLUCOSE: 154 mg/dL

## 2017-02-03 DIAGNOSIS — M25552 Pain in left hip: Principal | ICD-10-CM

## 2017-02-03 DIAGNOSIS — R7309 Other abnormal glucose: Secondary | ICD-10-CM | POA: Insufficient documentation

## 2017-02-03 DIAGNOSIS — I10 Essential (primary) hypertension: Secondary | ICD-10-CM

## 2017-02-03 DIAGNOSIS — Z862 Personal history of diseases of the blood and blood-forming organs and certain disorders involving the immune mechanism: Secondary | ICD-10-CM | POA: Insufficient documentation

## 2017-02-03 DIAGNOSIS — M25551 Pain in right hip: Secondary | ICD-10-CM | POA: Insufficient documentation

## 2017-02-03 DIAGNOSIS — I890 Lymphedema, not elsewhere classified: Secondary | ICD-10-CM | POA: Insufficient documentation

## 2017-02-03 HISTORY — DX: Essential (primary) hypertension: I10

## 2017-02-04 MED ORDER — METFORMIN HCL 500 MG PO TABS: 500.0000 mg | ORAL_TABLET | Freq: Two times a day (BID) | ORAL | 3 refills | Status: DC

## 2017-02-04 NOTE — Addendum Note (Signed)
Addended by: Scot Jun on: 02/04/2017 10:46 PM   Modules accepted: Orders

## 2017-02-05 ENCOUNTER — Telehealth: Payer: Self-pay | Admitting: Family Medicine

## 2017-02-05 ENCOUNTER — Other Ambulatory Visit: Payer: Self-pay | Admitting: Family Medicine

## 2017-02-05 MED ORDER — ONDANSETRON HCL 4 MG PO TABS: 4.0000 mg | ORAL_TABLET | Freq: Three times a day (TID) | ORAL | 0 refills | Status: DC | PRN

## 2017-02-05 MED ORDER — PREDNISONE 20 MG PO TABS: 20.0000 mg | ORAL_TABLET | Freq: Every day | ORAL | 0 refills | Status: AC

## 2017-02-05 NOTE — Progress Notes (Signed)
Extended prednisone coarse for 5 additional days of 20 mg and patient is experiencing some nausea, prescribing Zofran 4 mg.

## 2017-02-05 NOTE — Telephone Encounter (Signed)
erronious

## 2017-02-17 ENCOUNTER — Ambulatory Visit (INDEPENDENT_AMBULATORY_CARE_PROVIDER_SITE_OTHER): Payer: Medicare Other | Admitting: Orthopaedic Surgery

## 2017-02-17 ENCOUNTER — Ambulatory Visit (INDEPENDENT_AMBULATORY_CARE_PROVIDER_SITE_OTHER): Payer: Medicare Other

## 2017-02-17 ENCOUNTER — Encounter (INDEPENDENT_AMBULATORY_CARE_PROVIDER_SITE_OTHER): Payer: Self-pay | Admitting: Orthopaedic Surgery

## 2017-02-17 VITALS — Resp 14 | Ht 64.0 in | Wt 263.0 lb

## 2017-02-17 DIAGNOSIS — M25551 Pain in right hip: Secondary | ICD-10-CM

## 2017-02-17 DIAGNOSIS — M25552 Pain in left hip: Secondary | ICD-10-CM
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
## 2017-02-24 ENCOUNTER — Ambulatory Visit (INDEPENDENT_AMBULATORY_CARE_PROVIDER_SITE_OTHER): Payer: Medicare Other | Admitting: Physical Medicine and Rehabilitation

## 2017-02-24 ENCOUNTER — Telehealth (INDEPENDENT_AMBULATORY_CARE_PROVIDER_SITE_OTHER): Payer: Self-pay | Admitting: Physical Medicine and Rehabilitation

## 2017-02-24 NOTE — Telephone Encounter (Signed)
I called pt and r/s todays appt to 03/20/17. She is already scheduled for 03/06/17 for the other hip.

## 2017-02-24 NOTE — Telephone Encounter (Signed)
Can you call this patient? Looks like her appointment isn't until 4/26 anyway
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
## 2017-02-27 ENCOUNTER — Other Ambulatory Visit: Payer: Self-pay | Admitting: Family Medicine

## 2017-03-06 ENCOUNTER — Ambulatory Visit (INDEPENDENT_AMBULATORY_CARE_PROVIDER_SITE_OTHER): Payer: Self-pay

## 2017-03-06 ENCOUNTER — Encounter (INDEPENDENT_AMBULATORY_CARE_PROVIDER_SITE_OTHER): Payer: Self-pay | Admitting: Physical Medicine and Rehabilitation

## 2017-03-06 ENCOUNTER — Ambulatory Visit (INDEPENDENT_AMBULATORY_CARE_PROVIDER_SITE_OTHER): Payer: Medicare Other | Admitting: Physical Medicine and Rehabilitation

## 2017-03-06 VITALS — BP 132/84

## 2017-03-06 DIAGNOSIS — M25552 Pain in left hip: Secondary | ICD-10-CM

## 2017-03-06 NOTE — Progress Notes (Deleted)
Left hip pain since last May.Gotten worse over time. Constant pain. Radiates down left leg to foot and into toes at times. Groin pain. Catching. Difficulty getting in and out of the car.   Rt hip pain also. Had injection on 01/31/17 with family doctor for right and has had relief. Scheduled for right hip with [place] next week.

## 2017-03-06 NOTE — Patient Instructions (Signed)

## 2017-03-06 NOTE — Progress Notes (Signed)
Hayley Alvarado - 61 y.o. female MRN [id number]  Date of birth: [birth year]  Office Visit Note: Visit Date: 03/06/2017 PCP: Molli Barrows, FNP Referred by: Scot Jun, FNP  Subjective: Chief Complaint  Patient presents with  . Left Hip - Pain   HPI: Hayley Alvarado is a 61 year old female followed by [name] who requested left diagnostic and hopefully therapeutic anesthetic hip arthrogram. She does have bilateral hip pain left more than right. She did have a recent intramuscular injection by her family practice physician who did give her some relief. Left hip pain has been going on since May of last year. She has some worsening constant pain and does radiate seemingly down to the left foot and toes and time. She does get catching sensation is getting in and out of car. Depending on the relief of this injection she is scheduled for potential right hip injection a week from now.    ROS Otherwise per HPI.  Assessment & Plan: Visit Diagnoses:  1. Pain in left hip     Plan: Findings:  Diagnostic and therapeutic left hip anesthetic arthrogram. The patient did not seem to notice much relief with the anesthetic phase of the injection. Fluoroscopic imaging and arthrogram were excellent well-placed. She does have arthritis in this hip. She will take note of how the cortisone portion of the injection favors her.    Meds & Orders: No orders of the defined types were placed in this encounter.   Orders Placed This Encounter  Procedures  . Large Joint Injection/Arthrocentesis  . XR C-ARM NO REPORT    Follow-up: No Follow-up on file.   Procedures: Large Joint Inj Date/Time: 03/06/2017 4:47 PM Performed by: Magnus Sinning Authorized by: Magnus Sinning   Consent Given by:  Patient Site marked: the procedure site was marked   Timeout: prior to procedure the correct patient, procedure, and site was verified   Indications:  Pain and diagnostic evaluation Location:  Hip Site:   L hip joint Prep: patient was prepped and draped in usual sterile fashion   Needle Size:  22 G Approach:  Anterior Ultrasound Guidance: No   Fluoroscopic Guidance: No   Arthrogram: Yes   Medications:  80 mg triamcinolone acetonide 40 MG/ML; 3 mL bupivacaine 0.5 % Aspiration Attempted: Yes   Patient tolerance:  Patient tolerated the procedure well with no immediate complications  Arthrogram demonstrated excellent flow of contrast throughout the joint surface without extravasation or obvious defect.  The patient had relief of symptoms during the anesthetic phase of the injection.     No notes on file   Clinical History: No specialty comments available.  She reports that she has never smoked. She has never used smokeless tobacco.   Recent Labs  01/31/17 1518  HGBA1C 7.0*    Objective:  VS:  HT:    WT:   BMI:     BP:132/84  HR: bpm  TEMP: ( )  RESP:  Physical Exam  Musculoskeletal:  Patient is very slow to rise from a seated position. She does have bilateral edema in the lower extremities. She has pain with hip rotation. She has good distal strength.    Ortho Exam Imaging: Xr C-arm No Report  Result Date: 03/06/2017 Please see Notes or Procedures tab for imaging impression.   Past Medical/Family/Surgical/Social History: Medications & Allergies reviewed per EMR Patient Active Problem List   Diagnosis Date Noted  . Essential hypertension 02/03/2017  . Bilateral hip pain 02/03/2017  . Elevated hemoglobin A1c  02/03/2017  . Hx of iron deficiency anemia 02/03/2017  . Lymphedema 02/03/2017   Past Medical History:  Diagnosis Date  . Arthritis   . Diabetes mellitus without complication ([place])   . Hypertension    Family History  Problem Relation Age of Onset  . Diabetes Other    Past Surgical History:  Procedure Laterality Date  . btl    . KNEE SURGERY    . SKIN GRAFT     Social History   Occupational History  . Not on file.   Social History Main Topics  .  Smoking status: Never Smoker  . Smokeless tobacco: Never Used  . Alcohol use No  . Drug use: No  . Sexual activity: Not on file

## 2017-03-07 MED ORDER — TRIAMCINOLONE ACETONIDE 40 MG/ML IJ SUSP
80.0000 mg | INTRAMUSCULAR | Status: AC | PRN
  Administered 2017-03-06: 80 mg via INTRA_ARTICULAR

## 2017-03-07 MED ORDER — BUPIVACAINE HCL 0.5 % IJ SOLN
3.0000 mL | INTRAMUSCULAR | Status: AC | PRN
  Administered 2017-03-06: 3 mL via INTRA_ARTICULAR

## 2017-03-20 ENCOUNTER — Ambulatory Visit (INDEPENDENT_AMBULATORY_CARE_PROVIDER_SITE_OTHER): Payer: Medicare Other

## 2017-03-20 ENCOUNTER — Encounter (INDEPENDENT_AMBULATORY_CARE_PROVIDER_SITE_OTHER): Payer: Self-pay | Admitting: Physical Medicine and Rehabilitation

## 2017-03-20 ENCOUNTER — Ambulatory Visit (INDEPENDENT_AMBULATORY_CARE_PROVIDER_SITE_OTHER): Payer: Medicare Other | Admitting: Physical Medicine and Rehabilitation

## 2017-03-20 VITALS — BP 171/89

## 2017-03-20 DIAGNOSIS — M25551 Pain in right hip: Secondary | ICD-10-CM | POA: Diagnosis not present

## 2017-03-20 NOTE — Progress Notes (Deleted)
Patient is here today for right hip injection. Pain into groin area. Occasional leg pain to just below knee.  States she has done well with the left hip injection she had 2 weeks ago. Has had at least 60% relief.

## 2017-03-20 NOTE — Patient Instructions (Signed)

## 2017-03-20 NOTE — Progress Notes (Signed)
Hayley Alvarado - 61 y.o. female MRN [id number]  Date of birth: April 27, 1956  Office Visit Note: Visit Date: 03/20/2017 PCP: Scot Jun, FNP Referred by: Scot Jun, FNP  Subjective: Chief Complaint  Patient presents with  . Right Hip - Pain   HPI: Hayley Alvarado is a 61 year old female that we saw on 03/06/2017 and provided a left diagnostic and hopefully therapeutic anesthetic hip arthrogram. She initially had difficulty knowing whether the anesthetic phase helped we did decide was helping some. She tells me that after the injection she has had approximately 60% relief from that injection. She is here today for the planned same procedure on the right side. She has significant osteoarthritis bilaterally.    ROS Otherwise per HPI.  Assessment & Plan: Visit Diagnoses:  1. Pain in right hip     Plan: Findings:  Diagnostic and hopefully therapeutic right anesthetic hip arthrogram. Once again the patient has mild relief during the anesthetic phase.    Meds & Orders: No orders of the defined types were placed in this encounter.   Orders Placed This Encounter  Procedures  . Large Joint Injection/Arthrocentesis  . XR C-ARM NO REPORT    Follow-up: Return if symptoms worsen or fail to improve, for [name].   Procedures: Diagnostic and therapeutic anesthetic hip arthrogram Date/Time: 03/20/2017 2:05 PM Performed by: Magnus Sinning Authorized by: Magnus Sinning   Consent Given by:  Patient Site marked: the procedure site was marked   Timeout: prior to procedure the correct patient, procedure, and site was verified   Indications:  Pain and diagnostic evaluation Location:  Hip Site:  R hip joint Prep: patient was prepped and draped in usual sterile fashion   Needle Size:  22 G Approach:  Anterior Ultrasound Guidance: No   Fluoroscopic Guidance: No   Arthrogram: Yes   Medications:  3 mL bupivacaine 0.5 %; 80 mg triamcinolone acetonide 40 MG/ML Aspiration  Attempted: Yes   Patient tolerance:  Patient tolerated the procedure well with no immediate complications  Arthrogram demonstrated excellent flow of contrast throughout the joint surface without extravasation or obvious defect.  The patient had some relief of symptoms during the anesthetic phase of the injection.      No notes on file   Clinical History: No specialty comments available.  She reports that she has never smoked. She has never used smokeless tobacco.   Recent Labs  01/31/17 1518  HGBA1C 7.0*    Objective:  VS:  HT:    WT:   BMI:     BP:(!) 171/89  HR: bpm  TEMP: ( )  RESP:  Physical Exam  Musculoskeletal:  Patient ambulates with a cane with an antalgic gait. She has significant swelling in the lower extremities bilaterally she does have pain with hip rotation bilaterally.    Ortho Exam Imaging: Xr C-arm No Report  Result Date: 03/20/2017 Please see Notes or Procedures tab for imaging impression.   Past Medical/Family/Surgical/Social History: Medications & Allergies reviewed per EMR Patient Active Problem List   Diagnosis Date Noted  . Essential hypertension 02/03/2017  . Bilateral hip pain 02/03/2017  . Elevated hemoglobin A1c 02/03/2017  . Hx of iron deficiency anemia 02/03/2017  . Lymphedema 02/03/2017   Past Medical History:  Diagnosis Date  . Arthritis   . Diabetes mellitus without complication ([place])   . Hypertension    Family History  Problem Relation Age of Onset  . Diabetes Other    Past Surgical History:  Procedure Laterality  Date  . btl    . KNEE SURGERY    . SKIN GRAFT     Social History   Occupational History  . Not on file.   Social History Main Topics  . Smoking status: Never Smoker  . Smokeless tobacco: Never Used  . Alcohol use No  . Drug use: No  . Sexual activity: Not on file

## 2017-03-21 MED ORDER — BUPIVACAINE HCL 0.5 % IJ SOLN
3.0000 mL | INTRAMUSCULAR | Status: AC | PRN
  Administered 2017-03-20: 3 mL via INTRA_ARTICULAR

## 2017-03-21 MED ORDER — TRIAMCINOLONE ACETONIDE 40 MG/ML IJ SUSP
80.0000 mg | INTRAMUSCULAR | Status: AC | PRN
  Administered 2017-03-20: 80 mg via INTRA_ARTICULAR

## 2017-04-02 ENCOUNTER — Other Ambulatory Visit: Payer: Self-pay | Admitting: Family Medicine

## 2017-04-03 ENCOUNTER — Other Ambulatory Visit: Payer: Self-pay | Admitting: Family Medicine

## 2017-05-05 ENCOUNTER — Ambulatory Visit: Payer: Medicare Other | Admitting: Family Medicine

## 2017-05-08 ENCOUNTER — Other Ambulatory Visit: Payer: Self-pay | Admitting: Family Medicine

## 2017-06-06 ENCOUNTER — Other Ambulatory Visit: Payer: Self-pay | Admitting: Family Medicine

## 2017-06-07 ENCOUNTER — Other Ambulatory Visit: Payer: Self-pay | Admitting: Family Medicine

## 2017-07-28 ENCOUNTER — Other Ambulatory Visit: Payer: Self-pay | Admitting: Family Medicine

## 2017-08-18 ENCOUNTER — Ambulatory Visit: Payer: Medicare Other | Admitting: Family Medicine

## 2017-08-25 ENCOUNTER — Ambulatory Visit: Payer: Medicare Other | Admitting: Family Medicine

## 2017-10-27 ENCOUNTER — Telehealth (INDEPENDENT_AMBULATORY_CARE_PROVIDER_SITE_OTHER): Payer: Self-pay | Admitting: Physical Medicine and Rehabilitation

## 2017-10-27 NOTE — Telephone Encounter (Signed)
If they helped then ok to repeat worst first then other in 2 weeks, if no helpe then f.up with Durward Fortes

## 2017-10-27 NOTE — Telephone Encounter (Signed)
Scheduled for right hip 11/10/17 and left hip 11/24/17

## 2017-10-29 ENCOUNTER — Telehealth: Payer: Self-pay

## 2017-10-29 NOTE — Telephone Encounter (Signed)
Patient would like a Rx for pain until she is seen by Dr. Ernestina Patches on 11/10/17.  Cb# is [phone].  Please advise.  Thank you

## 2017-10-29 NOTE — Telephone Encounter (Signed)
LVMOM for her to come in for a visit w/[name]- last seen in April. Also PW said she coucd ask Dr. Ernestina Patches since he been her dr since April. Per PW

## 2017-11-10 ENCOUNTER — Ambulatory Visit (INDEPENDENT_AMBULATORY_CARE_PROVIDER_SITE_OTHER): Payer: Medicare Other | Admitting: Physical Medicine and Rehabilitation

## 2017-11-10 ENCOUNTER — Ambulatory Visit (INDEPENDENT_AMBULATORY_CARE_PROVIDER_SITE_OTHER): Payer: Medicare Other

## 2017-11-10 ENCOUNTER — Encounter (INDEPENDENT_AMBULATORY_CARE_PROVIDER_SITE_OTHER): Payer: Self-pay | Admitting: Physical Medicine and Rehabilitation

## 2017-11-10 DIAGNOSIS — M25551 Pain in right hip: Secondary | ICD-10-CM | POA: Diagnosis not present

## 2017-11-10 NOTE — Progress Notes (Signed)
Hayley Alvarado - 61 y.o. female MRN [id number]  Date of birth: 03/31/1956  Office Visit Note: Visit Date: 11/10/2017 PCP: Scot Jun, FNP Referred by: Scot Jun, FNP  Subjective: Chief Complaint  Patient presents with  . Right Hip - Pain  . Left Hip - Pain   HPI: Hayley Alvarado is a 61 year old with bilateral hip pain and bilateral osteoarthritis.  We have completed prior injections of both hips both with good relief of her symptoms more than 60% relief.  These injections were in April and May.  She returns today with increasing similar symptoms with no new trauma.  This does appear to be related to her hips.  We are going to complete a right intra-articular hip injection today with fluoroscopic guidance.  We will see her in a couple weeks for the other side.    ROS Otherwise per HPI.  Assessment & Plan: Visit Diagnoses:  1. Pain in right hip     Plan: Findings:  Diagnostic and hopefully therapeutic intra-articular hip injection with fluoroscopic guidance on the right.  Patient did have relief during the anesthetic phase.  She will continue to follow-up with [name] for orthopedic care.  He can help dictate numbers of injections but obviously long-term problem with doing the injections as long as are done infrequently.    Meds & Orders: No orders of the defined types were placed in this encounter.   Orders Placed This Encounter  Procedures  . Large Joint Inj: R hip joint  . XR C-ARM NO REPORT    Follow-up: Return if symptoms worsen or fail to improve, for [name].   Procedures: Large Joint Inj: R hip joint on 11/10/2017 9:10 AM Indications: diagnostic evaluation and pain Details: 22 G 3.5 in needle, fluoroscopy-guided anterior approach  Arthrogram: No  Medications: 80 mg triamcinolone acetonide 40 MG/ML; 3 mL bupivacaine 0.5 % Outcome: tolerated well, no immediate complications  There was excellent flow of contrast producing a partial  arthrogram of the hip. The patient did have relief of symptoms during the anesthetic phase of the injection. Procedure, treatment alternatives, risks and benefits explained, specific risks discussed. Consent was given by the patient. Immediately prior to procedure a time out was called to verify the correct patient, procedure, equipment, support staff and site/side marked as required. Patient was prepped and draped in the usual sterile fashion.      No notes on file   Clinical History: No specialty comments available.  She reports that  has never smoked. she has never used smokeless tobacco.  Recent Labs    01/31/17 1518  HGBA1C 7.0*    Objective:  VS:  HT:    WT:   BMI:     BP:   HR: bpm  TEMP: ( )  RESP:  Physical Exam  Ortho Exam Imaging: Xr C-arm No Report  Result Date: 11/20/2017 Please see Notes or Procedures tab for imaging impression.   Past Medical/Family/Surgical/Social History: Medications & Allergies reviewed per EMR Patient Active Problem List   Diagnosis Date Noted  . Essential hypertension 02/03/2017  . Bilateral hip pain 02/03/2017  . Elevated hemoglobin A1c 02/03/2017  . Hx of iron deficiency anemia 02/03/2017  . Lymphedema 02/03/2017   Past Medical History:  Diagnosis Date  . Arthritis   . Diabetes mellitus without complication ([place])   . Hypertension    Family History  Problem Relation Age of Onset  . Diabetes Other    Past Surgical History:  Procedure Laterality  Date  . btl    . KNEE SURGERY    . SKIN GRAFT     Social History   Occupational History  . Not on file  Tobacco Use  . Smoking status: Never Smoker  . Smokeless tobacco: Never Used  Substance and Sexual Activity  . Alcohol use: No  . Drug use: No  . Sexual activity: Not on file

## 2017-11-10 NOTE — Progress Notes (Deleted)
Pt states a deep pain in right and left hip. Pt states left and right groin pain. Sitting, standing, using the bathroom causes pain to increase. -Dye Allergies.

## 2017-11-10 NOTE — Patient Instructions (Signed)

## 2017-11-20 ENCOUNTER — Ambulatory Visit (INDEPENDENT_AMBULATORY_CARE_PROVIDER_SITE_OTHER): Payer: Medicare Other | Admitting: Physical Medicine and Rehabilitation

## 2017-11-20 ENCOUNTER — Encounter (INDEPENDENT_AMBULATORY_CARE_PROVIDER_SITE_OTHER): Payer: Self-pay | Admitting: Physical Medicine and Rehabilitation

## 2017-11-20 ENCOUNTER — Ambulatory Visit (INDEPENDENT_AMBULATORY_CARE_PROVIDER_SITE_OTHER): Payer: Self-pay

## 2017-11-20 DIAGNOSIS — M25552 Pain in left hip: Secondary | ICD-10-CM | POA: Diagnosis not present

## 2017-11-20 NOTE — Progress Notes (Signed)
   Hayley Alvarado - 62 y.o. female MRN [id number]  Date of birth: 10/01/1956  Office Visit Note: Visit Date: 11/20/2017 PCP: Scot Jun, FNP Referred by: Scot Jun, FNP  Subjective: Chief Complaint  Patient presents with  . Left Hip - Pain   HPI: Hayley Alvarado is a 62 year old female with chronic bilateral hip pain with osteoarthritis.  She recently underwent right hip injection with good relief of her symptoms.  We are going to complete the left today.  He has had prior hip injections in the past which were beneficial and diagnostic.    ROS Otherwise per HPI.  Assessment & Plan: Visit Diagnoses:  1. Pain in left hip     Plan: Findings:  Left intra-articular hip injection with fluoroscopic guidance.  Patient did have relief during the anesthetic phase of the injection.    Meds & Orders: No orders of the defined types were placed in this encounter.   Orders Placed This Encounter  Procedures  . Large Joint Inj: L hip joint  . XR C-ARM NO REPORT    Follow-up: No Follow-up on file.   Procedures: Large Joint Inj: L hip joint on 11/20/2017 3:57 PM Indications: pain and diagnostic evaluation Details: 22 G needle, anterior approach  Arthrogram: Yes  Medications: 80 mg triamcinolone acetonide 40 MG/ML; 3 mL bupivacaine 0.5 % Outcome: tolerated well, no immediate complications  Arthrogram demonstrated excellent flow of contrast throughout the joint surface without extravasation or obvious defect.  The patient had relief of symptoms during the anesthetic phase of the injection.  Procedure, treatment alternatives, risks and benefits explained, specific risks discussed. Consent was given by the patient. Immediately prior to procedure a time out was called to verify the correct patient, procedure, equipment, support staff and site/side marked as required. Patient was prepped and draped in the usual sterile fashion.      No notes on file   Clinical History: No  specialty comments available.  She reports that  has never smoked. she has never used smokeless tobacco.  Recent Labs    01/31/17 1518  HGBA1C 7.0*    Objective:  VS:  HT:    WT:   BMI:     BP:   HR: bpm  TEMP: ( )  RESP:  Physical Exam  Ortho Exam Imaging: Xr C-arm No Report  Result Date: 11/20/2017 Please see Notes or Procedures tab for imaging impression.   Past Medical/Family/Surgical/Social History: Medications & Allergies reviewed per EMR Patient Active Problem List   Diagnosis Date Noted  . Essential hypertension 02/03/2017  . Bilateral hip pain 02/03/2017  . Elevated hemoglobin A1c 02/03/2017  . Hx of iron deficiency anemia 02/03/2017  . Lymphedema 02/03/2017   Past Medical History:  Diagnosis Date  . Arthritis   . Diabetes mellitus without complication ([place])   . Hypertension    Family History  Problem Relation Age of Onset  . Diabetes Other    Past Surgical History:  Procedure Laterality Date  . btl    . KNEE SURGERY    . SKIN GRAFT     Social History   Occupational History  . Not on file  Tobacco Use  . Smoking status: Never Smoker  . Smokeless tobacco: Never Used  Substance and Sexual Activity  . Alcohol use: No  . Drug use: No  . Sexual activity: Not on file

## 2017-11-20 NOTE — Progress Notes (Deleted)
Pt states a sharp pain in left hip. Pt states pain has been going on for about 3 years. Pt states injections help tremendously and looks forward to them each time. Pt states sitting down, climbing steps, and walking makes pain worse, nothing makes it better. -Dye Allergies.

## 2017-11-20 NOTE — Patient Instructions (Signed)

## 2017-11-21 MED ORDER — BUPIVACAINE HCL 0.5 % IJ SOLN
3.0000 mL | INTRAMUSCULAR | Status: AC | PRN
  Administered 2017-11-10: 3 mL via INTRA_ARTICULAR

## 2017-11-21 MED ORDER — TRIAMCINOLONE ACETONIDE 40 MG/ML IJ SUSP
80.0000 mg | INTRAMUSCULAR | Status: AC | PRN
  Administered 2017-11-10: 80 mg via INTRA_ARTICULAR

## 2017-11-21 MED ORDER — BUPIVACAINE HCL 0.5 % IJ SOLN
3.0000 mL | INTRAMUSCULAR | Status: AC | PRN
  Administered 2017-11-20: 3 mL via INTRA_ARTICULAR

## 2017-11-21 MED ORDER — TRIAMCINOLONE ACETONIDE 40 MG/ML IJ SUSP
80.0000 mg | INTRAMUSCULAR | Status: AC | PRN
  Administered 2017-11-20: 80 mg via INTRA_ARTICULAR

## 2017-11-24 ENCOUNTER — Ambulatory Visit (INDEPENDENT_AMBULATORY_CARE_PROVIDER_SITE_OTHER): Payer: Medicare Other | Admitting: Physical Medicine and Rehabilitation

## 2018-01-01 ENCOUNTER — Other Ambulatory Visit: Payer: Self-pay | Admitting: Family Medicine

## 2018-01-16 ENCOUNTER — Telehealth (INDEPENDENT_AMBULATORY_CARE_PROVIDER_SITE_OTHER): Payer: Self-pay | Admitting: *Deleted

## 2018-01-18 ENCOUNTER — Other Ambulatory Visit: Payer: Self-pay | Admitting: Family Medicine

## 2018-01-19 NOTE — Telephone Encounter (Signed)
She really needs to f/up with Dr. Donella Stade given that we have completed two injections, if they helped greatly then could consider repeat but she has not had follow up

## 2018-01-19 NOTE — Telephone Encounter (Signed)
CAN WE PLEASE SET UP FOR APPT? [place]

## 2018-01-19 NOTE — Telephone Encounter (Signed)
Yes, needs follow up visit to see me

## 2018-01-19 NOTE — Telephone Encounter (Signed)
Please advise. Thank you

## 2018-01-19 NOTE — Telephone Encounter (Signed)
Called pt to inform that she needs to set up a F/U appt post injections 11/10/17 and 11/20/17.

## 2018-01-25 ENCOUNTER — Other Ambulatory Visit: Payer: Self-pay | Admitting: Family Medicine

## 2018-01-26 ENCOUNTER — Encounter (INDEPENDENT_AMBULATORY_CARE_PROVIDER_SITE_OTHER): Payer: Self-pay | Admitting: Orthopaedic Surgery

## 2018-01-26 ENCOUNTER — Ambulatory Visit (INDEPENDENT_AMBULATORY_CARE_PROVIDER_SITE_OTHER): Payer: Medicare Other | Admitting: Orthopaedic Surgery

## 2018-01-26 VITALS — BP 197/98 | HR 105 | Resp 18 | Ht 64.0 in | Wt 257.0 lb

## 2018-01-26 DIAGNOSIS — M25552 Pain in left hip: Secondary | ICD-10-CM | POA: Diagnosis not present

## 2018-01-26 DIAGNOSIS — M25551 Pain in right hip: Secondary | ICD-10-CM | POA: Diagnosis not present
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
## 2018-01-27 ENCOUNTER — Other Ambulatory Visit: Payer: Self-pay | Admitting: Family Medicine

## 2018-02-10 ENCOUNTER — Encounter (INDEPENDENT_AMBULATORY_CARE_PROVIDER_SITE_OTHER): Payer: Self-pay | Admitting: Physical Medicine and Rehabilitation

## 2018-02-10 ENCOUNTER — Ambulatory Visit (INDEPENDENT_AMBULATORY_CARE_PROVIDER_SITE_OTHER): Payer: Self-pay | Admitting: Physical Medicine and Rehabilitation

## 2018-02-10 ENCOUNTER — Ambulatory Visit (INDEPENDENT_AMBULATORY_CARE_PROVIDER_SITE_OTHER): Payer: Medicare Other | Admitting: Physical Medicine and Rehabilitation

## 2018-02-10 ENCOUNTER — Ambulatory Visit (INDEPENDENT_AMBULATORY_CARE_PROVIDER_SITE_OTHER): Payer: Medicare Other

## 2018-02-10 VITALS — BP 172/86 | HR 88 | Temp 98.4°F | Ht 64.0 in | Wt 257.0 lb

## 2018-02-10 DIAGNOSIS — M25551 Pain in right hip: Secondary | ICD-10-CM

## 2018-02-10 DIAGNOSIS — M25552 Pain in left hip: Secondary | ICD-10-CM

## 2018-02-10 MED ORDER — BUPIVACAINE HCL 0.5 % IJ SOLN
3.0000 mL | INTRAMUSCULAR | Status: AC | PRN
  Administered 2018-02-10: 3 mL via INTRA_ARTICULAR

## 2018-02-10 MED ORDER — TRIAMCINOLONE ACETONIDE 40 MG/ML IJ SUSP
80.0000 mg | INTRAMUSCULAR | Status: AC | PRN
  Administered 2018-02-10: 80 mg via INTRA_ARTICULAR

## 2018-02-10 NOTE — Progress Notes (Signed)
 .  Numeric Pain Rating Scale and Functional Assessment Average Pain 8   In the last MONTH (on 0-10 scale) has pain interfered with the following?  1. General activity like being  able to carry out your everyday physical activities such as walking, climbing stairs, carrying groceries, or moving a chair?  Rating(8)   +Driver, -BT, -Dye Allergies.  

## 2018-02-10 NOTE — Patient Instructions (Signed)

## 2018-02-10 NOTE — Progress Notes (Signed)
Hayley Alvarado - 62 y.o. female MRN [id number]  Date of birth: 19-Sep-1956  Office Visit Note: Visit Date: 02/10/2018 PCP: Scot Jun, FNP Referred by: Scot Jun, FNP  Subjective: Chief Complaint  Patient presents with  . Left Hip - Pain  . Right Hip - Pain   HPI: Hayley Alvarado is a 62 year old female with end-stage also arthritis of both hips.  She has had worsening symptoms over the last couple of weeks with pain in the hip and groin left equal to right.  We completed both diagnostic and therapeutic injections the last one being in January.  The left seems to be a little bit worse than the right but pretty equal.  She is been following up with [name].  She has had no new trauma.  We are going to complete a left-sided injection today with fluoroscopic guidance.  Have her come back for the other side soon.  She is going to consider hip replacement some point but she has a lot of issues going on right now from a psychosocial standpoint.   ROS Otherwise per HPI.  Assessment & Plan: Visit Diagnoses:  1. Pain in left hip   2. Pain in right hip     Plan: No additional findings.   Meds & Orders: No orders of the defined types were placed in this encounter.   Orders Placed This Encounter  Procedures  . Large Joint Inj: L hip joint  . XR C-ARM NO REPORT    Follow-up: Return for Right hip injection with fluoro.   Procedures: Large Joint Inj: L hip joint on 02/10/2018 2:05 PM Indications: diagnostic evaluation and pain Details: 22 G 3.5 in needle, fluoroscopy-guided anterior approach  Arthrogram: No  Medications: 80 mg triamcinolone acetonide 40 MG/ML; 3 mL bupivacaine 0.5 % Outcome: tolerated well, no immediate complications  There was excellent flow of contrast producing a partial arthrogram of the hip. The patient did have relief of symptoms during the anesthetic phase of the injection. Procedure, treatment alternatives, risks and benefits explained,  specific risks discussed. Consent was given by the patient. Immediately prior to procedure a time out was called to verify the correct patient, procedure, equipment, support staff and site/side marked as required. Patient was prepped and draped in the usual sterile fashion.      No notes on file   Clinical History: No specialty comments available.   She reports that she has never smoked. She has never used smokeless tobacco. No results for input(s): HGBA1C, LABURIC in the last 8760 hours.  Objective:  VS:  HT:5\' 4"  (162.6 cm)   WT:257 lb (116.6 kg)  BMI:44.09    BP:(!) 168/98  HR:88bpm  TEMP:98.4 F (36.9 C)( )  RESP:98 % Physical Exam  Ortho Exam Imaging: No results found.  Past Medical/Family/Surgical/Social History: Medications & Allergies reviewed per EMR, new medications updated. Patient Active Problem List   Diagnosis Date Noted  . Essential hypertension 02/03/2017  . Bilateral hip pain 02/03/2017  . Elevated hemoglobin A1c 02/03/2017  . Hx of iron deficiency anemia 02/03/2017  . Lymphedema 02/03/2017   Past Medical History:  Diagnosis Date  . Arthritis   . Diabetes mellitus without complication ([place])   . Hypertension    Family History  Problem Relation Age of Onset  . Diabetes Other    Past Surgical History:  Procedure Laterality Date  . btl    . KNEE SURGERY    . SKIN GRAFT    . TUBAL LIGATION  Social History   Occupational History  . Not on file  Tobacco Use  . Smoking status: Never Smoker  . Smokeless tobacco: Never Used  Substance and Sexual Activity  . Alcohol use: No  . Drug use: No  . Sexual activity: Not on file

## 2018-02-17 ENCOUNTER — Encounter (INDEPENDENT_AMBULATORY_CARE_PROVIDER_SITE_OTHER): Payer: Self-pay | Admitting: Physical Medicine and Rehabilitation

## 2018-02-17 ENCOUNTER — Ambulatory Visit (INDEPENDENT_AMBULATORY_CARE_PROVIDER_SITE_OTHER): Payer: Medicare Other | Admitting: Physical Medicine and Rehabilitation

## 2018-02-17 ENCOUNTER — Ambulatory Visit (INDEPENDENT_AMBULATORY_CARE_PROVIDER_SITE_OTHER): Payer: Medicare Other

## 2018-02-17 DIAGNOSIS — M25551 Pain in right hip: Secondary | ICD-10-CM

## 2018-02-17 MED ORDER — TRIAMCINOLONE ACETONIDE 40 MG/ML IJ SUSP
80.0000 mg | INTRAMUSCULAR | Status: AC | PRN
  Administered 2018-02-17: 80 mg via INTRA_ARTICULAR

## 2018-02-17 MED ORDER — BUPIVACAINE HCL 0.5 % IJ SOLN
3.0000 mL | INTRAMUSCULAR | Status: AC | PRN
  Administered 2018-02-17: 3 mL via INTRA_ARTICULAR

## 2018-02-17 NOTE — Progress Notes (Signed)
 .  Numeric Pain Rating Scale and Functional Assessment Average Pain 6   In the last MONTH (on 0-10 scale) has pain interfered with the following?  1. General activity like being  able to carry out your everyday physical activities such as walking, climbing stairs, carrying groceries, or moving a chair?  Rating(4)   +Driver, -BT, -Dye Allergies.  

## 2018-02-17 NOTE — Progress Notes (Signed)
Hayley Alvarado - 62 y.o. female MRN [id number]  Date of birth: 1956-09-24  Office Visit Note: Visit Date: 02/17/2018 PCP: Scot Jun, FNP Referred by: Scot Jun, FNP  Subjective: Chief Complaint  Patient presents with  . Right Hip - Pain   HPI: Hayley Alvarado is a very pleasant 62 year old female with end-stage hip osteoarthritis of the bilateral hips.  We completed recent left intra-articular hip injection with good relief of symptoms and she is doing better.  She is more mobile.  She is still using a walker.  Her right side is still problematic and more than her repeat an injection today for that.  She is hopefully looking at hip replacement sometime this coming year.   ROS Otherwise per HPI.  Assessment & Plan: Visit Diagnoses:  1. Pain in right hip     Plan: Findings:  Diagnostic and therapeutic right intra-articular hip injection with fluoroscopic guidance.  Patient had good relief during the anesthetic phase.    Meds & Orders: No orders of the defined types were placed in this encounter.   Orders Placed This Encounter  Procedures  . Large Joint Inj: R hip joint  . Large Joint Inj  . XR C-ARM NO REPORT    Follow-up: Return if symptoms worsen or fail to improve, for [name].   Procedures: Large Joint Inj: R hip joint on 02/17/2018 4:56 PM Indications: diagnostic evaluation and pain Details: 22 G 3.5 in needle, fluoroscopy-guided anterior approach  Arthrogram: No  Medications: 80 mg triamcinolone acetonide 40 MG/ML; 3 mL bupivacaine 0.5 % Outcome: tolerated well, no immediate complications  There was excellent flow of contrast producing a partial arthrogram of the hip. The patient did have relief of symptoms during the anesthetic phase of the injection. Procedure, treatment alternatives, risks and benefits explained, specific risks discussed. Consent was given by the patient. Immediately prior to procedure a time out was called to verify the  correct patient, procedure, equipment, support staff and site/side marked as required. Patient was prepped and draped in the usual sterile fashion.      No notes on file   Clinical History: No specialty comments available.   She reports that she has never smoked. She has never used smokeless tobacco. No results for input(s): HGBA1C, LABURIC in the last 8760 hours.  Objective:  VS:  HT:    WT:   BMI:     BP:   HR: bpm  TEMP: ( )  RESP:  Physical Exam  Musculoskeletal:  Patient ambulates with a walker.  She is very slow to rise from seated position.  She has bilateral leg swelling.  She does have concordant pain with hip rotation really with any movement.    Ortho Exam Imaging: Xr C-arm No Report  Result Date: 02/17/2018 Please see Notes or Procedures tab for imaging impression.   Past Medical/Family/Surgical/Social History: Medications & Allergies reviewed per EMR, new medications updated. Patient Active Problem List   Diagnosis Date Noted  . Essential hypertension 02/03/2017  . Bilateral hip pain 02/03/2017  . Elevated hemoglobin A1c 02/03/2017  . Hx of iron deficiency anemia 02/03/2017  . Lymphedema 02/03/2017   Past Medical History:  Diagnosis Date  . Arthritis   . Diabetes mellitus without complication ([place])   . Hypertension    Family History  Problem Relation Age of Onset  . Diabetes Other    Past Surgical History:  Procedure Laterality Date  . btl    . KNEE SURGERY    .  SKIN GRAFT    . TUBAL LIGATION     Social History   Occupational History  . Not on file  Tobacco Use  . Smoking status: Never Smoker  . Smokeless tobacco: Never Used  Substance and Sexual Activity  . Alcohol use: No  . Drug use: No  . Sexual activity: Not on file

## 2018-02-17 NOTE — Patient Instructions (Signed)

## 2018-02-23 ENCOUNTER — Other Ambulatory Visit (INDEPENDENT_AMBULATORY_CARE_PROVIDER_SITE_OTHER): Payer: Self-pay | Admitting: Physical Medicine and Rehabilitation

## 2018-02-23 ENCOUNTER — Telehealth (INDEPENDENT_AMBULATORY_CARE_PROVIDER_SITE_OTHER): Payer: Self-pay | Admitting: Physical Medicine and Rehabilitation

## 2018-02-23 MED ORDER — METHOCARBAMOL 500 MG PO TABS: 500.0000 mg | ORAL_TABLET | Freq: Three times a day (TID) | ORAL | 0 refills | Status: DC | PRN

## 2018-02-23 NOTE — Telephone Encounter (Signed)
Sent in [place] to pharm on record, f/up with [name] if needed

## 2018-02-23 NOTE — Telephone Encounter (Signed)
Called patient to advise  °

## 2018-03-11 ENCOUNTER — Other Ambulatory Visit (INDEPENDENT_AMBULATORY_CARE_PROVIDER_SITE_OTHER): Payer: Self-pay | Admitting: Physical Medicine and Rehabilitation

## 2018-03-11 NOTE — Telephone Encounter (Signed)
Please advise 

## 2018-03-31 ENCOUNTER — Telehealth (INDEPENDENT_AMBULATORY_CARE_PROVIDER_SITE_OTHER): Payer: Self-pay | Admitting: *Deleted

## 2018-04-01 NOTE — Telephone Encounter (Signed)
She will need to follow-up with [name] and may need to talk to him about hip replacements.  We really cannot do another shot this quickly.

## 2018-04-01 NOTE — Telephone Encounter (Signed)
Called pt and left vm  

## 2018-04-07 ENCOUNTER — Telehealth (INDEPENDENT_AMBULATORY_CARE_PROVIDER_SITE_OTHER): Payer: Self-pay | Admitting: Physical Medicine and Rehabilitation

## 2018-04-08 NOTE — Telephone Encounter (Signed)
Spoke with pt and advised her to schedule a follow up with [name]

## 2018-04-10 ENCOUNTER — Ambulatory Visit (INDEPENDENT_AMBULATORY_CARE_PROVIDER_SITE_OTHER): Payer: Self-pay | Admitting: Physical Medicine and Rehabilitation

## 2018-04-15 ENCOUNTER — Ambulatory Visit (INDEPENDENT_AMBULATORY_CARE_PROVIDER_SITE_OTHER): Payer: Medicare Other | Admitting: Family Medicine

## 2018-04-15 ENCOUNTER — Encounter: Payer: Self-pay | Admitting: Family Medicine

## 2018-04-15 VITALS — BP 144/80 | HR 90 | Temp 98.7°F | Ht 64.0 in | Wt 259.0 lb

## 2018-04-15 DIAGNOSIS — R7309 Other abnormal glucose: Secondary | ICD-10-CM | POA: Diagnosis not present

## 2018-04-15 DIAGNOSIS — M25552 Pain in left hip: Secondary | ICD-10-CM | POA: Diagnosis not present

## 2018-04-15 DIAGNOSIS — R609 Edema, unspecified: Secondary | ICD-10-CM

## 2018-04-15 DIAGNOSIS — M25551 Pain in right hip: Secondary | ICD-10-CM | POA: Diagnosis not present

## 2018-04-15 DIAGNOSIS — I872 Venous insufficiency (chronic) (peripheral): Secondary | ICD-10-CM | POA: Diagnosis not present

## 2018-04-15 DIAGNOSIS — I10 Essential (primary) hypertension: Secondary | ICD-10-CM

## 2018-04-15 DIAGNOSIS — Z131 Encounter for screening for diabetes mellitus: Secondary | ICD-10-CM

## 2018-04-15 DIAGNOSIS — E118 Type 2 diabetes mellitus with unspecified complications: Secondary | ICD-10-CM

## 2018-04-15 DIAGNOSIS — Z09 Encounter for follow-up examination after completed treatment for conditions other than malignant neoplasm: Secondary | ICD-10-CM | POA: Diagnosis not present

## 2018-04-15 DIAGNOSIS — R600 Localized edema: Secondary | ICD-10-CM

## 2018-04-15 LAB — POCT URINALYSIS DIP (MANUAL ENTRY)
Bilirubin, UA: NEGATIVE
Glucose, UA: NEGATIVE mg/dL
Ketones, POC UA: NEGATIVE mg/dL
Leukocytes, UA: NEGATIVE
Nitrite, UA: NEGATIVE
Protein Ur, POC: 30 mg/dL — AB
Spec Grav, UA: 1.025 (ref 1.010–1.025)
Urobilinogen, UA: 0.2 E.U./dL
pH, UA: 5 (ref 5.0–8.0)

## 2018-04-15 LAB — POCT GLYCOSYLATED HEMOGLOBIN (HGB A1C): Hemoglobin A1C: 7.2 % — AB (ref 4.0–5.6)

## 2018-04-16 ENCOUNTER — Telehealth: Payer: Self-pay

## 2018-04-16 MED ORDER — GABAPENTIN 400 MG PO CAPS: ORAL_CAPSULE | ORAL | 0 refills | Status: DC

## 2018-04-16 MED ORDER — MELOXICAM 15 MG PO TABS: 15.0000 mg | ORAL_TABLET | Freq: Every day | ORAL | 0 refills | Status: DC

## 2018-04-16 MED ORDER — METFORMIN HCL 500 MG PO TABS: 500.0000 mg | ORAL_TABLET | Freq: Two times a day (BID) | ORAL | 3 refills | Status: DC

## 2018-04-16 MED ORDER — HYDROCHLOROTHIAZIDE 25 MG PO TABS: 25.0000 mg | ORAL_TABLET | Freq: Every day | ORAL | 3 refills | Status: DC

## 2018-04-16 NOTE — Progress Notes (Signed)
Subjective:    Patient ID: Hayley Alvarado, female    DOB: 01/02/1956, 62 y.o.   MRN: [id number]   PCP: Kathe Becton, NP  Chief Complaint  Patient presents with  . Follow-up    medication, HTN  . Hip Pain    HPI  [name] has a history of Hypertension, Diabetes, and Arthritis. She is here today for follow up.   Current Status: Since her last office visit, she has began to have left leg spasms. She is currently on Robaxin, prescribed by Dr. Ernestina Patches at [facility], to help the with discomfort. Dr. Ernestina Patches has referred her to Dr. Kerin Perna for possible knee surgery. She also has right arm/shoulder arthritis. She denies falls, unsteadiness, confusion, and falls. She has had mild visual changes and headaches. She denies chest pain, cough, heart palpitations, and shortness of breath.   She denies peripheral neuropathy. She has a good appetite. She denies abdominal pain, nausea, vomiting, diarrhea, and constipation.   She has been having increased bilateral lower extremity edema for the past 3 weeks.   She is currently not taking Metformin.   Past Medical History:  Diagnosis Date  . Arthritis   . Diabetes mellitus without complication ([place])   . Hypertension   ' Family History  Problem Relation Age of Onset  . Diabetes Other     Social History   Socioeconomic History  . Marital status: Single    Spouse name: Not on file  . Number of children: Not on file  . Years of education: Not on file  . Highest education level: Not on file  Occupational History  . Not on file  Social Needs  . Financial resource strain: Not on file  . Food insecurity:    Worry: Not on file    Inability: Not on file  . Transportation needs:    Medical: Not on file    Non-medical: Not on file  Tobacco Use  . Smoking status: Never Smoker  . Smokeless tobacco: Never Used  Substance and Sexual Activity  . Alcohol use: No  . Drug use: No  . Sexual activity: Not on file  Lifestyle  .  Physical activity:    Days per week: Not on file    Minutes per session: Not on file  . Stress: Not on file  Relationships  . Social connections:    Talks on phone: Not on file    Gets together: Not on file    Attends religious service: Not on file    Active member of club or organization: Not on file    Attends meetings of clubs or organizations: Not on file    Relationship status: Not on file  . Intimate partner violence:    Fear of current or ex partner: Not on file    Emotionally abused: Not on file    Physically abused: Not on file    Forced sexual activity: Not on file  Other Topics Concern  . Not on file  Social History Narrative  . Not on file    Past Surgical History:  Procedure Laterality Date  . btl    . KNEE SURGERY    . SKIN GRAFT    . TUBAL LIGATION      Immunization History  Administered Date(s) Administered  . Influenza,inj,Quad PF,6+ Mos 01/31/2017  . Tdap 01/31/2017    Current Meds  Medication Sig  . methocarbamol (ROBAXIN) 500 MG tablet TAKE 1 TABLET (500 MG TOTAL) BY MOUTH EVERY 8 (EIGHT)  HOURS AS NEEDED FOR MUSCLE SPASMS.    Allergies  Allergen Reactions  . Lisinopril Cough  . Morphine And Related   . Verapamil Other (See Comments)  . Morphine Other (See Comments)    flushing    BP (!) 144/80 (BP Location: Left Arm, Patient Position: Sitting, Cuff Size: Large)   Pulse 90   Temp 98.7 F (37.1 C) (Oral)   Ht 5\' 4"  (1.626 m)   Wt 259 lb (117.5 kg)   SpO2 92%   BMI 44.46 kg/m     Review of Systems  Constitutional: Negative.   HENT: Negative.   Eyes: Negative.   Respiratory: Negative.   Cardiovascular: Negative.   Gastrointestinal: Negative.   Endocrine: Negative.   Genitourinary: Negative.   Musculoskeletal: Positive for arthralgias (right arm and shoulder).  Skin: Negative.   Allergic/Immunologic: Negative.   Neurological: Positive for headaches (occasional).  Hematological: Negative.   Psychiatric/Behavioral: Negative.     Objective:     Physical Exam  Constitutional: She is oriented to person, place, and time and well-developed, well-nourished, and in no distress.  HENT:  Head: Normocephalic and atraumatic.  Right Ear: External ear normal.  Left Ear: External ear normal.  Mouth/Throat: Oropharynx is clear and moist.  Eyes: Pupils are equal, round, and reactive to light. Conjunctivae and EOM are normal.  Neck: Normal range of motion. Neck supple.  Cardiovascular: Normal rate, regular rhythm, normal heart sounds and intact distal pulses.  Pulmonary/Chest: Effort normal and breath sounds normal.  Abdominal: Soft. Bowel sounds are normal.  Musculoskeletal:  Bilateral lower extremity edema.  Neurological: She is alert and oriented to person, place, and time. She has normal reflexes. Gait normal.  Skin: Skin is warm and dry.  Psychiatric: Mood and memory normal.  Nursing note and vitals reviewed.  Assessment & Plan:   1. Screening for diabetes mellitus - POCT glycosylated hemoglobin (Hb A1C)  2. Elevated hemoglobin A1c Hgb A1c is 7.2 today.  - POCT glycosylated hemoglobin (Hb A1C) - POCT urinalysis dipstick  3. Essential hypertension Blood pressure is 144/80 today. She will continue HCTZ as directed.  - hydrochlorothiazide (HYDRODIURIL) 25 MG tablet; Take 1 tablet (25 mg total) by mouth daily.  Dispense: 30 tablet; Refill: 3  4. Bilateral hip pain She will follow up with Dr. Kerin Perna for possible hip surgery.  - meloxicam (MOBIC) 15 MG tablet; Take 1 tablet (15 mg total) by mouth daily.  Dispense: 30 tablet; Refill: 0 - gabapentin (NEURONTIN) 400 MG capsule; TAKE ONE CAPSULE BY MOUTH 4 TIMES A DAY  Dispense: 120 capsule; Refill: 0  5. Type 2 diabetes mellitus with complication, without long-term current use of insulin ([place]) She will restart Metformin and take as directed. A1c has mildly increased to 7.2 from 7.0 within a year.  - metFORMIN (GLUCOPHAGE) 500 MG tablet; Take 1 tablet (500 mg total)  by mouth 2 (two) times daily with a meal.  Dispense: 120 tablet; Refill: 3  6. 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 She will continue to elevate extremities as needed. Possible to add Lasix at next office visit if not improved.   7. Follow up She will follow up in 1 month.   Meds ordered this encounter  Medications  . hydrochlorothiazide (HYDRODIURIL) 25 MG tablet    Sig: Take 1 tablet (25 mg total) by mouth daily.    Dispense:  30 tablet    Refill:  3  . meloxicam (MOBIC) 15 MG  tablet    Sig: Take 1 tablet (15 mg total) by mouth daily.    Dispense:  30 tablet    Refill:  0  . gabapentin (NEURONTIN) 400 MG capsule    Sig: TAKE ONE CAPSULE BY MOUTH 4 TIMES A DAY    Dispense:  120 capsule    Refill:  0  . metFORMIN (GLUCOPHAGE) 500 MG tablet    Sig: Take 1 tablet (500 mg total) by mouth 2 (two) times daily with a meal.    Dispense:  120 tablet    Refill:  3   Kathe Becton,  MSN, FNP-BC [facility] [address] [place], [place] [address] [phone]

## 2018-04-19 ENCOUNTER — Other Ambulatory Visit (INDEPENDENT_AMBULATORY_CARE_PROVIDER_SITE_OTHER): Payer: Self-pay | Admitting: Physical Medicine and Rehabilitation

## 2018-04-20 NOTE — Telephone Encounter (Signed)
Please advise 

## 2018-04-20 NOTE — Telephone Encounter (Signed)
Completed.

## 2018-04-28 ENCOUNTER — Telehealth: Payer: Self-pay

## 2018-04-28 DIAGNOSIS — I10 Essential (primary) hypertension: Secondary | ICD-10-CM

## 2018-04-28 MED ORDER — HYDROCHLOROTHIAZIDE 25 MG PO TABS: 25.0000 mg | ORAL_TABLET | Freq: Every day | ORAL | 3 refills | Status: AC

## 2018-04-28 NOTE — Telephone Encounter (Signed)
I sent in the blood pressure medication but I didn't see anything for a rash to resend

## 2018-05-18 ENCOUNTER — Ambulatory Visit: Payer: Medicare Other | Admitting: Family Medicine

## 2018-05-30 ENCOUNTER — Other Ambulatory Visit (INDEPENDENT_AMBULATORY_CARE_PROVIDER_SITE_OTHER): Payer: Self-pay | Admitting: Physical Medicine and Rehabilitation

## 2018-06-01 NOTE — Telephone Encounter (Signed)
Please advise 

## 2018-06-30 ENCOUNTER — Other Ambulatory Visit (INDEPENDENT_AMBULATORY_CARE_PROVIDER_SITE_OTHER): Payer: Self-pay | Admitting: Physical Medicine and Rehabilitation

## 2018-06-30 NOTE — Telephone Encounter (Signed)
Please advise 

## 2018-09-04 ENCOUNTER — Other Ambulatory Visit: Payer: Self-pay | Admitting: Family Medicine

## 2018-09-08 ENCOUNTER — Other Ambulatory Visit: Payer: Self-pay | Admitting: Family Medicine

## 2018-09-08 ENCOUNTER — Encounter (HOSPITAL_COMMUNITY): Payer: Self-pay | Admitting: Emergency Medicine

## 2018-09-08 ENCOUNTER — Inpatient Hospital Stay (HOSPITAL_COMMUNITY)
Admission: EM | Admit: 2018-09-08 | Discharge: 2018-09-12 | DRG: 641 | Disposition: A | Discharge status: Home Health | Payer: Medicare Other | Attending: Internal Medicine | Admitting: Internal Medicine

## 2018-09-08 ENCOUNTER — Emergency Department (HOSPITAL_COMMUNITY): Payer: Medicare Other

## 2018-09-08 ENCOUNTER — Other Ambulatory Visit: Payer: Self-pay

## 2018-09-08 ENCOUNTER — Observation Stay (HOSPITAL_COMMUNITY): Payer: Medicare Other

## 2018-09-08 DIAGNOSIS — Z833 Family history of diabetes mellitus: Secondary | ICD-10-CM

## 2018-09-08 DIAGNOSIS — M25552 Pain in left hip: Secondary | ICD-10-CM | POA: Diagnosis not present

## 2018-09-08 DIAGNOSIS — E876 Hypokalemia: Secondary | ICD-10-CM | POA: Diagnosis not present

## 2018-09-08 DIAGNOSIS — I10 Essential (primary) hypertension: Secondary | ICD-10-CM | POA: Diagnosis present

## 2018-09-08 DIAGNOSIS — M160 Bilateral primary osteoarthritis of hip: Secondary | ICD-10-CM

## 2018-09-08 DIAGNOSIS — Z6841 Body Mass Index (BMI) 40.0 and over, adult: Secondary | ICD-10-CM

## 2018-09-08 DIAGNOSIS — R262 Difficulty in walking, not elsewhere classified: Secondary | ICD-10-CM

## 2018-09-08 DIAGNOSIS — F4312 Post-traumatic stress disorder, chronic: Secondary | ICD-10-CM | POA: Diagnosis not present

## 2018-09-08 DIAGNOSIS — Z7984 Long term (current) use of oral hypoglycemic drugs: Secondary | ICD-10-CM

## 2018-09-08 DIAGNOSIS — IMO0002 Reserved for concepts with insufficient information to code with codable children: Secondary | ICD-10-CM | POA: Diagnosis present

## 2018-09-08 DIAGNOSIS — E1165 Type 2 diabetes mellitus with hyperglycemia: Secondary | ICD-10-CM | POA: Diagnosis present

## 2018-09-08 DIAGNOSIS — M25551 Pain in right hip: Secondary | ICD-10-CM

## 2018-09-08 DIAGNOSIS — M1612 Unilateral primary osteoarthritis, left hip: Secondary | ICD-10-CM

## 2018-09-08 DIAGNOSIS — Z7951 Long term (current) use of inhaled steroids: Secondary | ICD-10-CM

## 2018-09-08 DIAGNOSIS — H538 Other visual disturbances: Secondary | ICD-10-CM | POA: Diagnosis not present

## 2018-09-08 DIAGNOSIS — R52 Pain, unspecified: Secondary | ICD-10-CM | POA: Diagnosis not present

## 2018-09-08 DIAGNOSIS — E119 Type 2 diabetes mellitus without complications: Secondary | ICD-10-CM | POA: Diagnosis present

## 2018-09-08 DIAGNOSIS — I890 Lymphedema, not elsewhere classified: Secondary | ICD-10-CM | POA: Diagnosis present

## 2018-09-08 DIAGNOSIS — I160 Hypertensive urgency: Secondary | ICD-10-CM | POA: Diagnosis not present

## 2018-09-08 DIAGNOSIS — D509 Iron deficiency anemia, unspecified: Secondary | ICD-10-CM | POA: Diagnosis not present

## 2018-09-08 DIAGNOSIS — Z9119 Patient's noncompliance with other medical treatment and regimen: Secondary | ICD-10-CM

## 2018-09-08 DIAGNOSIS — Z79899 Other long term (current) drug therapy: Secondary | ICD-10-CM

## 2018-09-08 HISTORY — DX: Essential (primary) hypertension: I10

## 2018-09-08 HISTORY — DX: Type 2 diabetes mellitus with hyperglycemia: E11.65

## 2018-09-08 HISTORY — DX: Bilateral primary osteoarthritis of hip: M16.0

## 2018-09-08 HISTORY — DX: Iron deficiency anemia, unspecified: D50.9

## 2018-09-08 HISTORY — DX: Post-traumatic stress disorder, chronic: F43.12

## 2018-09-08 LAB — CBC WITH DIFFERENTIAL/PLATELET
ABS IMMATURE GRANULOCYTES: 0.04 10*3/uL (ref 0.00–0.07)
BASOS PCT: 0 %
Basophils Absolute: 0.1 10*3/uL (ref 0.0–0.1)
Eosinophils Absolute: 0.2 10*3/uL (ref 0.0–0.5)
Eosinophils Relative: 2 %
HCT: 37.8 % (ref 36.0–46.0)
Hemoglobin: 11.1 g/dL — ABNORMAL LOW (ref 12.0–15.0)
IMMATURE GRANULOCYTES: 0 %
Lymphocytes Relative: 14 %
Lymphs Abs: 1.6 10*3/uL (ref 0.7–4.0)
MCH: 21.7 pg — AB (ref 26.0–34.0)
MCHC: 29.4 g/dL — ABNORMAL LOW (ref 30.0–36.0)
MCV: 73.8 fL — ABNORMAL LOW (ref 80.0–100.0)
MONO ABS: 0.7 10*3/uL (ref 0.1–1.0)
Monocytes Relative: 6 %
NEUTROS ABS: 9.3 10*3/uL — AB (ref 1.7–7.7)
NEUTROS PCT: 78 %
Platelets: 222 10*3/uL (ref 150–400)
RBC: 5.12 MIL/uL — AB (ref 3.87–5.11)
RDW: 18.5 % — ABNORMAL HIGH (ref 11.5–15.5)
WBC: 11.9 10*3/uL — AB (ref 4.0–10.5)
nRBC: 0 % (ref 0.0–0.2)

## 2018-09-08 LAB — HEPATIC FUNCTION PANEL
ALT: 15 U/L (ref 0–44)
AST: 15 U/L (ref 15–41)
Albumin: 3.9 g/dL (ref 3.5–5.0)
Alkaline Phosphatase: 87 U/L (ref 38–126)
Total Bilirubin: 0.4 mg/dL (ref 0.3–1.2)
Total Protein: 7.4 g/dL (ref 6.5–8.1)

## 2018-09-08 LAB — PROTIME-INR
INR: 0.92
Prothrombin Time: 12.2 seconds (ref 11.4–15.2)

## 2018-09-08 LAB — PHOSPHORUS: Phosphorus: 4.1 mg/dL (ref 2.5–4.6)

## 2018-09-08 LAB — BASIC METABOLIC PANEL
Anion gap: 14 (ref 5–15)
BUN: 17 mg/dL (ref 6–20)
CALCIUM: 8.7 mg/dL — AB (ref 8.9–10.3)
CO2: 26 mmol/L (ref 22–32)
Chloride: 105 mmol/L (ref 98–111)
Creatinine, Ser: 0.82 mg/dL (ref 0.44–1.00)
GFR calc Af Amer: >60 mL/min (ref >60)
GLUCOSE: 192 mg/dL — AB (ref 70–99)
Potassium: 2.7 mmol/L — CL (ref 3.5–5.1)
Sodium: 145 mmol/L (ref 135–145)

## 2018-09-08 LAB — CBG MONITORING, ED: Glucose-Capillary: 204 mg/dL — ABNORMAL HIGH (ref 70–99)

## 2018-09-08 LAB — MAGNESIUM: MAGNESIUM: 1.8 mg/dL (ref 1.7–2.4)

## 2018-09-08 LAB — GLUCOSE, CAPILLARY: GLUCOSE-CAPILLARY: 148 mg/dL — AB (ref 70–99)

## 2018-09-08 MED ORDER — ACETAMINOPHEN 650 MG RE SUPP: 650.0000 mg | Freq: Four times a day (QID) | RECTAL | Status: DC | PRN

## 2018-09-08 MED ORDER — KETOROLAC TROMETHAMINE 30 MG/ML IJ SOLN
30.0000 mg | Freq: Four times a day (QID) | INTRAMUSCULAR | Status: DC | PRN
  Administered 2018-09-08 – 2018-09-09 (×2): 30 mg via INTRAVENOUS
  Filled 2018-09-08 (×2): qty 1

## 2018-09-08 MED ORDER — POTASSIUM CHLORIDE CRYS ER 20 MEQ PO TBCR
40.0000 meq | EXTENDED_RELEASE_TABLET | Freq: Once | ORAL | Status: AC
  Administered 2018-09-08: 40 meq via ORAL
  Filled 2018-09-08: qty 2

## 2018-09-08 MED ORDER — HYDRALAZINE HCL 20 MG/ML IJ SOLN
20.0000 mg | Freq: Once | INTRAMUSCULAR | Status: AC
  Administered 2018-09-08: 20 mg via INTRAVENOUS
  Filled 2018-09-08: qty 1

## 2018-09-08 MED ORDER — MAGNESIUM SULFATE 2 GM/50ML IV SOLN
2.0000 g | Freq: Once | INTRAVENOUS | Status: AC
  Administered 2018-09-08: 2 g via INTRAVENOUS
  Filled 2018-09-08: qty 50

## 2018-09-08 MED ORDER — ONDANSETRON HCL 4 MG/2ML IJ SOLN
4.0000 mg | Freq: Once | INTRAMUSCULAR | Status: AC
  Administered 2018-09-08: 4 mg via INTRAVENOUS
  Filled 2018-09-08: qty 2

## 2018-09-08 MED ORDER — HEPARIN SODIUM (PORCINE) 5000 UNIT/ML IJ SOLN
5000.0000 [IU] | Freq: Three times a day (TID) | INTRAMUSCULAR | Status: DC
  Administered 2018-09-08 – 2018-09-12 (×13): 5000 [IU] via SUBCUTANEOUS
  Filled 2018-09-08 (×13): qty 1

## 2018-09-08 MED ORDER — HYDROCHLOROTHIAZIDE 25 MG PO TABS
25.0000 mg | ORAL_TABLET | Freq: Every day | ORAL | Status: DC
  Administered 2018-09-08 – 2018-09-12 (×5): 25 mg via ORAL
  Filled 2018-09-08 (×5): qty 1

## 2018-09-08 MED ORDER — LABETALOL HCL 5 MG/ML IV SOLN
20.0000 mg | Freq: Once | INTRAVENOUS | Status: AC
  Administered 2018-09-08: 20 mg via INTRAVENOUS
  Filled 2018-09-08: qty 4

## 2018-09-08 MED ORDER — METFORMIN HCL 500 MG PO TABS
1000.0000 mg | ORAL_TABLET | Freq: Two times a day (BID) | ORAL | Status: DC
  Administered 2018-09-09 – 2018-09-12 (×8): 1000 mg via ORAL
  Filled 2018-09-08 (×8): qty 2

## 2018-09-08 MED ORDER — SODIUM CHLORIDE 0.45 % IV SOLN
INTRAVENOUS | Status: AC
  Administered 2018-09-08: 10:00:00 via INTRAVENOUS
  Filled 2018-09-08: qty 1000

## 2018-09-08 MED ORDER — INSULIN ASPART 100 UNIT/ML ~~LOC~~ SOLN
0.0000 [IU] | Freq: Three times a day (TID) | SUBCUTANEOUS | Status: DC
  Administered 2018-09-10 – 2018-09-11 (×3): 3 [IU] via SUBCUTANEOUS
  Administered 2018-09-12: 4 [IU] via SUBCUTANEOUS
  Administered 2018-09-12: 7 [IU] via SUBCUTANEOUS

## 2018-09-08 MED ORDER — HYDROMORPHONE HCL 2 MG/ML IJ SOLN
2.0000 mg | Freq: Once | INTRAMUSCULAR | Status: AC
  Administered 2018-09-08: 2 mg via INTRAMUSCULAR
  Filled 2018-09-08: qty 1

## 2018-09-08 MED ORDER — ACETAMINOPHEN 325 MG PO TABS
650.0000 mg | ORAL_TABLET | Freq: Four times a day (QID) | ORAL | Status: DC | PRN
  Administered 2018-09-09: 650 mg via ORAL
  Filled 2018-09-08 (×2): qty 2

## 2018-09-08 MED ORDER — ONDANSETRON HCL 4 MG PO TABS: 4.0000 mg | ORAL_TABLET | Freq: Four times a day (QID) | ORAL | Status: DC | PRN

## 2018-09-08 MED ORDER — SODIUM CHLORIDE 0.9 % IV SOLN
INTRAVENOUS | Status: DC | PRN
  Administered 2018-09-08: 250 mL via INTRAVENOUS

## 2018-09-08 MED ORDER — ONDANSETRON HCL 4 MG/2ML IJ SOLN
4.0000 mg | Freq: Four times a day (QID) | INTRAMUSCULAR | Status: DC | PRN
  Administered 2018-09-11: 4 mg via INTRAVENOUS
  Filled 2018-09-08: qty 2

## 2018-09-08 MED ORDER — POTASSIUM CHLORIDE 10 MEQ/100ML IV SOLN
10.0000 meq | INTRAVENOUS | Status: AC
  Administered 2018-09-08 (×3): 10 meq via INTRAVENOUS
  Filled 2018-09-08 (×3): qty 100

## 2018-09-08 MED ORDER — HYDROMORPHONE HCL 1 MG/ML IJ SOLN: 1.0000 mg | INTRAMUSCULAR | Status: DC | PRN

## 2018-09-08 NOTE — Care Management Note (Signed)
Case Management Note  Patient Details  Name: Mahogony Gilchrest MRN: [id number] Date of Birth: 03-07-1956  Subjective/Objective: Admitted w/HTN. From home w/dtr. Has old rw. CM referral for hospital bed,3n1. Informed patient we will confirm medical need for dme-unless out of pocket pay-voiced understanding.PT cons-await rec.cOn 02-will monitor.                   Action/Plan:d/c home.   Expected Discharge Date:  (unknown)               Expected Discharge Plan:     In-House Referral:     Discharge planning Services  CM Consult  Post Acute Care Choice:  Durable Medical Equipment(old rw) Choice offered to:     DME Arranged:    DME Agency:     HH Arranged:    HH Agency:     Status of Service:  In process, will continue to follow  If discussed at Long Length of Stay Meetings, dates discussed:    Additional Comments:  Dessa Phi, RN 09/08/2018, 12:05 PM

## 2018-09-08 NOTE — ED Triage Notes (Signed)
Pt arriving via [facility] with bilateral hip pain and increased difficulty moving. Pain has been present x2 months.

## 2018-09-08 NOTE — Care Management Obs Status (Signed)
[facility] NOTIFICATION   Patient Details  Name: Toshiye Kever MRN: [id number] Date of Birth: [birth year]   Medicare Observation Status Notification Given:  Yes    MahabirJuliann Pulse, RN 09/08/2018, 12:02 PM

## 2018-09-08 NOTE — ED Provider Notes (Signed)
[facility] DEPT Provider Note   CSN: [id number] Arrival date & time: 09/08/18  0330     History   Chief Complaint Chief Complaint  Patient presents with  . Hip Pain    bilateral    HPI Hayley Alvarado is a 62 y.o. female.  Patient is a 62 year old female with past medical history of diabetes, obesity, hypertension, and lymphedema.  She also has a history of chronic bilateral hip pain for which she has seen orthopedics.  She had a series of steroid injections with little improvement.  This evening after walking down the steps of her daughter's apartment, she developed severe pain in both hips, the right worse than the left.  She has had difficulty ambulating since.  She denies any new specific injury or trauma.  The history is provided by the patient.  Hip Pain  This is a new problem. The current episode started yesterday. The problem occurs constantly. The problem has been rapidly worsening. The symptoms are aggravated by walking. Nothing relieves the symptoms. She has tried nothing for the symptoms.    Past Medical History:  Diagnosis Date  . Arthritis   . Diabetes mellitus without complication ([place])   . Hypertension     Patient Active Problem List   Diagnosis Date Noted  . Essential hypertension 02/03/2017  . Bilateral hip pain 02/03/2017  . Elevated hemoglobin A1c 02/03/2017  . Hx of iron deficiency anemia 02/03/2017  . Lymphedema 02/03/2017    Past Surgical History:  Procedure Laterality Date  . btl    . KNEE SURGERY    . SKIN GRAFT    . TUBAL LIGATION       OB History   None      Home Medications    Prior to Admission medications   Medication Sig Start Date End Date Taking? Authorizing Provider  ferrous sulfate 325 (65 FE) MG tablet Take by mouth. 04/18/16   [provider]  fluticasone Asencion Islam) 50 MCG/ACT nasal spray One to two sprays in each nostril once daily as needed 04/18/16   [provider]    gabapentin (NEURONTIN) 400 MG capsule TAKE ONE CAPSULE BY MOUTH 4 TIMES A DAY 04/16/18   Azzie Glatter, FNP  hydrochlorothiazide (HYDRODIURIL) 25 MG tablet Take 1 tablet (25 mg total) by mouth daily. 04/28/18   Azzie Glatter, FNP  meloxicam (MOBIC) 15 MG tablet Take 1 tablet (15 mg total) by mouth daily. 04/16/18   Azzie Glatter, FNP  metFORMIN (GLUCOPHAGE) 500 MG tablet Take 1 tablet (500 mg total) by mouth 2 (two) times daily with a meal. 04/16/18   Azzie Glatter, FNP  methocarbamol (ROBAXIN) 500 MG tablet TAKE 1 TABLET (500 MG TOTAL) BY MOUTH EVERY 8 (EIGHT) HOURS AS NEEDED FOR MUSCLE SPASMS. 06/30/18   Magnus Sinning, MD    Family History Family History  Problem Relation Age of Onset  . Diabetes Other     Social History Social History   Tobacco Use  . Smoking status: Never Smoker  . Smokeless tobacco: Never Used  Substance Use Topics  . Alcohol use: No  . Drug use: No     Allergies   Lisinopril; Morphine and related; Verapamil; and Morphine   Review of Systems Review of Systems  All other systems reviewed and are negative.    Physical Exam Updated Vital Signs BP (!) 186/86 (BP Location: Right Arm)   Pulse 95   Temp 99.1 F (37.3 C) (Oral)   Resp  12   Ht 5\' 4"  (1.626 m)   Wt 114.3 kg   SpO2 98%   BMI 43.26 kg/m   Physical Exam  Constitutional: She is oriented to person, place, and time. She appears well-developed and well-nourished. No distress.  HENT:  Head: Normocephalic and atraumatic.  Neck: Normal range of motion. Neck supple.  Cardiovascular: Normal rate and regular rhythm. Exam reveals no gallop and no friction rub.  No murmur heard. Pulmonary/Chest: Effort normal and breath sounds normal. No respiratory distress. She has no wheezes.  Abdominal: Soft. Bowel sounds are normal. She exhibits no distension. There is no tenderness.  Musculoskeletal: Normal range of motion.  There is tenderness to palpation over both hips, the right greater  than the left.  She has significant discomfort with any range of motion.  Neurological: She is alert and oriented to person, place, and time.  Skin: Skin is warm and dry. She is not diaphoretic.  Nursing note and vitals reviewed.    ED Treatments / Results  Labs (all labs ordered are listed, but only abnormal results are displayed) Labs Reviewed - No data to display  EKG None  Radiology No results found.  Procedures Procedures (including critical care time)  Medications Ordered in ED Medications  HYDROmorphone (DILAUDID) injection 2 mg (has no administration in time range)     Initial Impression / Assessment and Plan / ED Course  I have reviewed the triage vital signs and the nursing notes.  Pertinent labs & imaging results that were available during my care of the patient were reviewed by me and considered in my medical decision making (see chart for details).  Patient presents with complaints of severe bilateral hip pain, the right worse than the left.  She has a history of severe osteoarthritis of the hips and has been followed by orthopedics.  She has received steroid injections with little relief.  Her pain became significantly worse this evening after walking down several stairs.  She is unable to ambulate and has severe pain with any range of motion.  Her x-rays this evening show severe osteoarthritis, however no new findings.  Patient was given Dilaudid with little relief.  I have discussed the case with [name] from orthopedics.  He is recommending admission to the hospitalist service for pain control and orthopedic consultation to discuss her options.  I have discussed the case with Dr. Olevia Bowens who agrees to admit.  Final Clinical Impressions(s) / ED Diagnoses   Final diagnoses:  None    ED Discharge Orders    None       Veryl Speak, MD 09/08/18 2329

## 2018-09-08 NOTE — ED Notes (Signed)
Bed: WA15 Expected date:  Expected time:  Means of arrival:  Comments: 

## 2018-09-08 NOTE — ED Notes (Signed)
Date and time results received: 09/08/18 8:25 AM (use smartphrase ".now" to insert current time)  Test: K Critical Value: 2.7  Name of Provider Notified: Olevia Bowens  Orders Received? Or Actions Taken?: awaiting orders
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
## 2018-09-08 NOTE — ED Notes (Signed)
Pt soiled, daughter requested to be able to clean Pt herself. Delay in starting IV and medicating due to this.

## 2018-09-08 NOTE — ED Notes (Signed)
ED TO INPATIENT HANDOFF REPORT  Name/Age/Gender Hayley Alvarado 62 y.o. female  Code Status    Code Status Orders  (From admission, onward)         Start     Ordered   09/08/18 0833  Full code  Continuous     09/08/18 0834        Code Status History    This patient has a current code status but no historical code status.      Home/SNF/Other Home  Chief Complaint Hip Pain  Level of Care/Admitting Diagnosis ED Disposition    ED Disposition Condition Comment   Admit  Hospital Area: [facility] [[id number]]  Level of Care: Telemetry [5]  Admit to tele based on following criteria: Monitor for Ischemic changes  Diagnosis: Hypokalemia [294765]  Admitting Physician: Reubin Milan [[id number]]  Attending Physician: Reubin Milan [[id number]]  PT Class (Do Not Modify): Observation [104]  PT Acc Code (Do Not Modify): Observation [10022]       Medical History Past Medical History:  Diagnosis Date  . Arthritis   . Chronic post-traumatic stress disorder 09/19/2014  . Diabetes mellitus without complication ([place])   . Essential hypertension 02/03/2017  . Hypertension   . Microcytic hypochromic anemia 09/18/2015  . Osteoarthritis of hips, bilateral 09/08/2018  . Type 2 diabetes mellitus, uncontrolled ([place]) 04/06/2015    Allergies Allergies  Allergen Reactions  . Lisinopril Cough  . Morphine And Related Other (See Comments)    flushing  . Verapamil Other (See Comments)  . Morphine Other (See Comments)    flushing    IV Location/Drains/Wounds Patient Lines/Drains/Airways Status   Active Line/Drains/Airways    None          Labs/Imaging Results for orders placed or performed during the hospital encounter of 09/08/18 (from the past 48 hour(s))  CBG monitoring, ED     Status: Abnormal   Collection Time: 09/08/18  7:42 AM  Result Value Ref Range   Glucose-Capillary 204 (H) 70 - 99 mg/dL  Basic metabolic panel     Status: Abnormal   Collection Time: 09/08/18  7:47 AM  Result Value Ref Range   Sodium 145 135 - 145 mmol/L   Potassium 2.7 (LL) 3.5 - 5.1 mmol/L    Comment: CRITICAL RESULT CALLED TO, READ BACK BY AND VERIFIED WITH: BRILL,M @ 0825 ON [id number] BY POTEAT,S    Chloride 105 98 - 111 mmol/L   CO2 26 22 - 32 mmol/L   Glucose, Bld 192 (H) 70 - 99 mg/dL   BUN 17 6 - 20 mg/dL   Creatinine, Ser 0.82 0.44 - 1.00 mg/dL   Calcium 8.7 (L) 8.9 - 10.3 mg/dL   GFR calc non Af Amer >60 >60 mL/min   GFR calc Af Amer >60 >60 mL/min    Comment: (NOTE) The eGFR has been calculated using the CKD EPI equation. This calculation has not been validated in all clinical situations. eGFR's persistently <60 mL/min signify possible Chronic Kidney Disease.    Anion gap 14 5 - 15    Comment: Performed at [facility], [place] [address], [place], [place] [address]  CBC with Differential     Status: Abnormal   Collection Time: 09/08/18  7:47 AM  Result Value Ref Range   WBC 11.9 (H) 4.0 - 10.5 K/uL   RBC 5.12 (H) 3.87 - 5.11 MIL/uL   Hemoglobin 11.1 (L) 12.0 - 15.0 g/dL   HCT 37.8 36.0 - 46.0 %  MCV 73.8 (L) 80.0 - 100.0 fL   MCH 21.7 (L) 26.0 - 34.0 pg   MCHC 29.4 (L) 30.0 - 36.0 g/dL   RDW 18.5 (H) 11.5 - 15.5 %   Platelets 222 150 - 400 K/uL   nRBC 0.0 0.0 - 0.2 %   Neutrophils Relative % 78 %   Neutro Abs 9.3 (H) 1.7 - 7.7 K/uL   Lymphocytes Relative 14 %   Lymphs Abs 1.6 0.7 - 4.0 K/uL   Monocytes Relative 6 %   Monocytes Absolute 0.7 0.1 - 1.0 K/uL   Eosinophils Relative 2 %   Eosinophils Absolute 0.2 0.0 - 0.5 K/uL   Basophils Relative 0 %   Basophils Absolute 0.1 0.0 - 0.1 K/uL   Immature Granulocytes 0 %   Abs Immature Granulocytes 0.04 0.00 - 0.07 K/uL    Comment: Performed at [facility], [place] [address], [place], [place] [address]  Protime-INR     Status: None   Collection Time: 09/08/18  7:47 AM  Result Value Ref Range   Prothrombin Time 12.2 11.4 - 15.2 seconds   INR  0.92     Comment: Performed at [facility], [place] [address] [place],  [address]   Dg Hips Bilat W Or Wo Pelvis 2 Views  Result Date: 09/08/2018 CLINICAL DATA:  62 y/o F; worsening bilateral hip pain over the last 2 months. EXAM: DG HIP (WITH OR WITHOUT PELVIS) 2V BILAT COMPARISON:  None. FINDINGS: Severe bilateral osteoarthrosis of the hip joints with loss of the joint space, sclerosis of articular surfaces, osteophytosis, and mild flattening of the femoral heads. No acute fracture. No pelvic diastases. IMPRESSION: Severe bilateral hip osteoarthrosis.  No acute fracture. Electronically Signed   By: Kristine Garbe M.D.   On: 09/08/2018 06:43   None  Pending Labs Unresulted Labs (From admission, onward)    Start     Ordered   09/09/18 0500  HIV antibody (Routine Testing)  Tomorrow morning,   R     09/08/18 0834   09/09/18 0500  CBC WITH DIFFERENTIAL  Tomorrow morning,   R     09/08/18 0834   09/09/18 4034  Basic metabolic panel  Tomorrow morning,   R     09/08/18 0834   09/08/18 0831  Phosphorus  Add-on,   R     09/08/18 0830   09/08/18 0830  Magnesium  Add-on,   R     09/08/18 0830   09/08/18 0811  Magnesium  Add-on,   R     09/08/18 0810   09/08/18 0811  Phosphorus  Add-on,   R     09/08/18 0810   09/08/18 0811  Hepatic function panel  Add-on,   R     09/08/18 0810          Vitals/Pain Today's Vitals   09/08/18 0645 09/08/18 0700 09/08/18 0710 09/08/18 0748  BP: (!) 194/95 (!) 201/100 (!) 201/100 (!) 205/119  Pulse: 96 88 88 95  Resp:   16 18  Temp:    97.7 F (36.5 C)  TempSrc:    Oral  SpO2: 95% (!) 89% 96% 96%  Weight:      Height:      PainSc:        Isolation Precautions No active isolations  Medications Medications  hydrochlorothiazide (HYDRODIURIL) tablet 25 mg (has no administration in time range)  labetalol (NORMODYNE,TRANDATE) injection 20 mg (has no administration in time range)  potassium chloride  SA  (K-DUR,KLOR-CON) CR tablet 40 mEq (has no administration in time range)  sodium chloride 0.45 % 1,000 mL with potassium chloride 40 mEq infusion (has no administration in time range)  potassium chloride 10 mEq in 100 mL IVPB (has no administration in time range)  heparin injection 5,000 Units (has no administration in time range)  acetaminophen (TYLENOL) tablet 650 mg (has no administration in time range)    Or  acetaminophen (TYLENOL) suppository 650 mg (has no administration in time range)  ondansetron (ZOFRAN) tablet 4 mg (has no administration in time range)    Or  ondansetron (ZOFRAN) injection 4 mg (has no administration in time range)  ketorolac (TORADOL) 30 MG/ML injection 30 mg (has no administration in time range)  HYDROmorphone (DILAUDID) injection 2 mg (2 mg Intramuscular Given 09/08/18 0448)    Mobility

## 2018-09-08 NOTE — ED Notes (Signed)
Patient transported to X-ray 

## 2018-09-09 DIAGNOSIS — I160 Hypertensive urgency: Secondary | ICD-10-CM | POA: Diagnosis present

## 2018-09-09 DIAGNOSIS — F4312 Post-traumatic stress disorder, chronic: Secondary | ICD-10-CM | POA: Diagnosis present

## 2018-09-09 DIAGNOSIS — M2550 Pain in unspecified joint: Secondary | ICD-10-CM | POA: Diagnosis not present

## 2018-09-09 DIAGNOSIS — I890 Lymphedema, not elsewhere classified: Secondary | ICD-10-CM | POA: Diagnosis present

## 2018-09-09 DIAGNOSIS — E1165 Type 2 diabetes mellitus with hyperglycemia: Secondary | ICD-10-CM | POA: Diagnosis present

## 2018-09-09 DIAGNOSIS — E876 Hypokalemia: Secondary | ICD-10-CM | POA: Diagnosis present

## 2018-09-09 DIAGNOSIS — M160 Bilateral primary osteoarthritis of hip: Secondary | ICD-10-CM | POA: Diagnosis present

## 2018-09-09 DIAGNOSIS — Z6841 Body Mass Index (BMI) 40.0 and over, adult: Secondary | ICD-10-CM | POA: Diagnosis not present

## 2018-09-09 DIAGNOSIS — M25552 Pain in left hip: Secondary | ICD-10-CM | POA: Diagnosis not present

## 2018-09-09 DIAGNOSIS — D509 Iron deficiency anemia, unspecified: Secondary | ICD-10-CM | POA: Diagnosis present

## 2018-09-09 DIAGNOSIS — Z7951 Long term (current) use of inhaled steroids: Secondary | ICD-10-CM | POA: Diagnosis not present

## 2018-09-09 DIAGNOSIS — Z7984 Long term (current) use of oral hypoglycemic drugs: Secondary | ICD-10-CM | POA: Diagnosis not present

## 2018-09-09 DIAGNOSIS — Z7401 Bed confinement status: Secondary | ICD-10-CM | POA: Diagnosis not present

## 2018-09-09 DIAGNOSIS — Z79899 Other long term (current) drug therapy: Secondary | ICD-10-CM | POA: Diagnosis not present

## 2018-09-09 DIAGNOSIS — Z833 Family history of diabetes mellitus: Secondary | ICD-10-CM | POA: Diagnosis not present

## 2018-09-09 DIAGNOSIS — I10 Essential (primary) hypertension: Secondary | ICD-10-CM | POA: Diagnosis present

## 2018-09-09 DIAGNOSIS — Z9119 Patient's noncompliance with other medical treatment and regimen: Secondary | ICD-10-CM | POA: Diagnosis not present

## 2018-09-09 DIAGNOSIS — M25551 Pain in right hip: Secondary | ICD-10-CM | POA: Diagnosis not present

## 2018-09-09 DIAGNOSIS — I5030 Unspecified diastolic (congestive) heart failure: Secondary | ICD-10-CM | POA: Diagnosis not present

## 2018-09-09 DIAGNOSIS — Z7952 Long term (current) use of systemic steroids: Secondary | ICD-10-CM | POA: Diagnosis not present

## 2018-09-09 LAB — HEMOGLOBIN A1C
Hgb A1c MFr Bld: 7 % — ABNORMAL HIGH (ref 4.8–5.6)
Mean Plasma Glucose: 154.2 mg/dL

## 2018-09-09 LAB — BASIC METABOLIC PANEL
ANION GAP: 6 (ref 5–15)
BUN: 17 mg/dL (ref 6–20)
CO2: 27 mmol/L (ref 22–32)
CREATININE: 0.91 mg/dL (ref 0.44–1.00)
Calcium: 8.1 mg/dL — ABNORMAL LOW (ref 8.9–10.3)
Chloride: 107 mmol/L (ref 98–111)
GFR calc non Af Amer: >60 mL/min (ref >60)
Glucose, Bld: 111 mg/dL — ABNORMAL HIGH (ref 70–99)
POTASSIUM: 3.5 mmol/L (ref 3.5–5.1)
SODIUM: 140 mmol/L (ref 135–145)

## 2018-09-09 LAB — CBC WITH DIFFERENTIAL/PLATELET
ABS IMMATURE GRANULOCYTES: 0.04 10*3/uL (ref 0.00–0.07)
BASOS ABS: 0.1 10*3/uL (ref 0.0–0.1)
Basophils Relative: 1 %
EOS PCT: 5 %
Eosinophils Absolute: 0.4 10*3/uL (ref 0.0–0.5)
HCT: 32.6 % — ABNORMAL LOW (ref 36.0–46.0)
HEMOGLOBIN: 9.9 g/dL — AB (ref 12.0–15.0)
IMMATURE GRANULOCYTES: 1 %
LYMPHS PCT: 25 %
Lymphs Abs: 2.2 10*3/uL (ref 0.7–4.0)
MCH: 22 pg — ABNORMAL LOW (ref 26.0–34.0)
MCHC: 30.4 g/dL (ref 30.0–36.0)
MCV: 72.6 fL — ABNORMAL LOW (ref 80.0–100.0)
Monocytes Absolute: 0.6 10*3/uL (ref 0.1–1.0)
Monocytes Relative: 7 %
NRBC: 0 % (ref 0.0–0.2)
Neutro Abs: 5.5 10*3/uL (ref 1.7–7.7)
Neutrophils Relative %: 61 %
Platelets: 206 10*3/uL (ref 150–400)
RBC: 4.49 MIL/uL (ref 3.87–5.11)
RDW: 17.9 % — ABNORMAL HIGH (ref 11.5–15.5)
WBC: 8.9 10*3/uL (ref 4.0–10.5)

## 2018-09-09 LAB — GLUCOSE, CAPILLARY
GLUCOSE-CAPILLARY: 113 mg/dL — AB (ref 70–99)
GLUCOSE-CAPILLARY: 90 mg/dL (ref 70–99)
Glucose-Capillary: 87 mg/dL (ref 70–99)
Glucose-Capillary: 129 mg/dL — ABNORMAL HIGH (ref 70–99)

## 2018-09-09 LAB — HIV ANTIBODY (ROUTINE TESTING W REFLEX): HIV Screen 4th Generation wRfx: NONREACTIVE

## 2018-09-09 MED ORDER — METHOCARBAMOL 500 MG PO TABS
500.0000 mg | ORAL_TABLET | Freq: Three times a day (TID) | ORAL | Status: DC | PRN
  Administered 2018-09-09 – 2018-09-12 (×2): 500 mg via ORAL
  Filled 2018-09-09 (×2): qty 1

## 2018-09-09 MED ORDER — POTASSIUM CHLORIDE CRYS ER 20 MEQ PO TBCR
40.0000 meq | EXTENDED_RELEASE_TABLET | Freq: Once | ORAL | Status: AC
  Administered 2018-09-09: 40 meq via ORAL
  Filled 2018-09-09: qty 2

## 2018-09-09 MED ORDER — MAGNESIUM OXIDE 400 (241.3 MG) MG PO TABS
400.0000 mg | ORAL_TABLET | Freq: Two times a day (BID) | ORAL | Status: DC
  Administered 2018-09-09 – 2018-09-12 (×7): 400 mg via ORAL
  Filled 2018-09-09 (×7): qty 1

## 2018-09-09 MED ORDER — OXYCODONE HCL 5 MG PO TABS
5.0000 mg | ORAL_TABLET | ORAL | Status: DC | PRN
  Administered 2018-09-09 – 2018-09-10 (×3): 5 mg via ORAL
  Filled 2018-09-09 (×4): qty 1

## 2018-09-09 MED ORDER — SENNA 8.6 MG PO TABS
1.0000 | ORAL_TABLET | Freq: Every day | ORAL | Status: DC
  Administered 2018-09-09 – 2018-09-11 (×3): 8.6 mg via ORAL
  Filled 2018-09-09 (×3): qty 1

## 2018-09-09 MED ORDER — KETOROLAC TROMETHAMINE 30 MG/ML IJ SOLN
30.0000 mg | Freq: Four times a day (QID) | INTRAMUSCULAR | Status: AC
  Administered 2018-09-09 – 2018-09-10 (×3): 30 mg via INTRAVENOUS
  Filled 2018-09-09 (×4): qty 1
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
## 2018-09-09 NOTE — Telephone Encounter (Signed)
-----   Message from Azzie Glatter, [place] sent at 09/04/2018  1:45 PM EDT ----- Regarding: "Refill on Atrovent Nasal Spray" Loraine Leriche to refill this medication. Please contact pharmacy to get last dosage that patient received....and approve.      Andria Frames,   Please contact patient have her to schedule follow up appointment within the next 3 months. We want to monitor Diabetes. She 'No-showed' her last appointment.    Thank you.

## 2018-09-09 NOTE — Telephone Encounter (Signed)
Left a vm for patient to callback 
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
## 2018-09-10 LAB — GLUCOSE, CAPILLARY
GLUCOSE-CAPILLARY: 115 mg/dL — AB (ref 70–99)
GLUCOSE-CAPILLARY: 136 mg/dL — AB (ref 70–99)
GLUCOSE-CAPILLARY: 110 mg/dL — AB (ref 70–99)
Glucose-Capillary: 140 mg/dL — ABNORMAL HIGH (ref 70–99)

## 2018-09-10 MED ORDER — OXYCODONE HCL 5 MG PO TABS
10.0000 mg | ORAL_TABLET | Freq: Four times a day (QID) | ORAL | Status: DC | PRN
  Administered 2018-09-10 – 2018-09-12 (×8): 10 mg via ORAL
  Filled 2018-09-10 (×8): qty 2

## 2018-09-10 MED ORDER — KETOROLAC TROMETHAMINE 30 MG/ML IJ SOLN: 30.0000 mg | Freq: Four times a day (QID) | INTRAMUSCULAR | Status: AC | PRN

## 2018-09-10 MED ORDER — POTASSIUM CHLORIDE CRYS ER 20 MEQ PO TBCR
40.0000 meq | EXTENDED_RELEASE_TABLET | Freq: Every day | ORAL | Status: DC
  Administered 2018-09-10 – 2018-09-12 (×3): 40 meq via ORAL
  Filled 2018-09-10 (×3): qty 2

## 2018-09-10 NOTE — Care Management Note (Signed)
Case Management Note  Patient Details  Name: Kendalynn Wideman MRN: [id number] Date of Birth: [birth year]  Subjective/Objective:PT recc HHPT/w/c with cushion.-[facility] chosen for [employer] aware of HHPT,w/c. Await final [place] orders.                    Action/Plan:dc home w/HHC/dme   Expected Discharge Date:  (unknown)               Expected Discharge Plan:  [place]  In-House Referral:     Discharge planning Services  CM Consult  Post Acute Care Choice:  Durable Medical Equipment(old rw) Choice offered to:  Patient  DME Arranged:  Wheelchair manual DME Agency:  [place]:  PT [place]:  [place]  Status of Service:  In process, will continue to follow  If discussed at Long Length of Stay Meetings, dates discussed:    Additional Comments:  Dessa Phi, RN 09/10/2018, 1:55 PM

## 2018-09-10 NOTE — Consult Note (Signed)
The patient is a very pleasant 62 year old female that I been asked to see by one of my partners for consideringhip replacement surgery.  She is a morbidly obese female with a BMI of 44.  She is a diabetic but has a hemoglobin A1c of 7.  She also has chronic lymphedema in her legs.  She was admitted to the hospitalist service due to the inability to mobilize from her severe bilateral hip pain.  She states that her right hip hurts worse than her left.  She has had intra-articular steroid injections as well.  I did review her x-rays and it does show severe end-stage arthritis of both her hips.  There is no evidence of fracture.  On clinical exam she has significant lymphedema in both of her legs below the knee and in her ankles.  She has significant truncal obesity with large pannus over each groin to the right and left and large thighs.  Unfortunately she is someone that I would not be comfortable performing hip replacement surgery on through the surgery that I do which is direct anterior hip surgery.  She is unfortunately too significantly obese for me to consider incisions in the front of her thighs that are growing because of her pannus and I could not get her on the special operating room table that we use for anterior hip surgery because we would need to place traction boots on both her feet for the surgery and even the extra-large boots would not fit on her feet and ankles due to her lymphedema.  Certainly weight loss would be helpful, but even if she lost a significant amount of weight, the lymphedema in her legs and ankles would prevent me from being able to perform anterior hip surgery.  She would likely need a posterior hip replacement which is not surgery are performed.  Also, with her BMI being well above 40, she is not a candidate for this surgery.
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
## 2018-09-10 NOTE — Progress Notes (Signed)
Patient's address:[address] [place].

## 2018-09-11 ENCOUNTER — Inpatient Hospital Stay (HOSPITAL_COMMUNITY): Payer: Medicare Other

## 2018-09-11 DIAGNOSIS — I10 Essential (primary) hypertension: Secondary | ICD-10-CM

## 2018-09-11 DIAGNOSIS — E1165 Type 2 diabetes mellitus with hyperglycemia: Secondary | ICD-10-CM

## 2018-09-11 DIAGNOSIS — M160 Bilateral primary osteoarthritis of hip: Secondary | ICD-10-CM

## 2018-09-11 DIAGNOSIS — M25551 Pain in right hip: Secondary | ICD-10-CM

## 2018-09-11 DIAGNOSIS — E876 Hypokalemia: Principal | ICD-10-CM

## 2018-09-11 DIAGNOSIS — D509 Iron deficiency anemia, unspecified: Secondary | ICD-10-CM

## 2018-09-11 DIAGNOSIS — I5030 Unspecified diastolic (congestive) heart failure: Secondary | ICD-10-CM

## 2018-09-11 DIAGNOSIS — M25552 Pain in left hip: Secondary | ICD-10-CM

## 2018-09-11 LAB — CBC WITH DIFFERENTIAL/PLATELET
Abs Immature Granulocytes: 0.06 10*3/uL (ref 0.00–0.07)
BASOS ABS: 0.1 10*3/uL (ref 0.0–0.1)
Basophils Relative: 1 %
EOS PCT: 6 %
Eosinophils Absolute: 0.4 10*3/uL (ref 0.0–0.5)
HCT: 36.8 % (ref 36.0–46.0)
HEMOGLOBIN: 11 g/dL — AB (ref 12.0–15.0)
Immature Granulocytes: 1 %
LYMPHS PCT: 29 %
Lymphs Abs: 2.3 10*3/uL (ref 0.7–4.0)
MCH: 21.9 pg — ABNORMAL LOW (ref 26.0–34.0)
MCHC: 29.9 g/dL — ABNORMAL LOW (ref 30.0–36.0)
MCV: 73.2 fL — ABNORMAL LOW (ref 80.0–100.0)
Monocytes Absolute: 0.6 10*3/uL (ref 0.1–1.0)
Monocytes Relative: 8 %
NRBC: 0 % (ref 0.0–0.2)
Neutro Abs: 4.5 10*3/uL (ref 1.7–7.7)
Neutrophils Relative %: 55 %
PLATELETS: 233 10*3/uL (ref 150–400)
RBC: 5.03 MIL/uL (ref 3.87–5.11)
RDW: 18.1 % — AB (ref 11.5–15.5)
WBC: 8 10*3/uL (ref 4.0–10.5)

## 2018-09-11 LAB — GLUCOSE, CAPILLARY
GLUCOSE-CAPILLARY: 94 mg/dL (ref 70–99)
GLUCOSE-CAPILLARY: 110 mg/dL — AB (ref 70–99)
Glucose-Capillary: 124 mg/dL — ABNORMAL HIGH (ref 70–99)
Glucose-Capillary: 78 mg/dL (ref 70–99)

## 2018-09-11 LAB — ECHOCARDIOGRAM COMPLETE
HEIGHTINCHES: 64 in
Weight: 4051.2 oz

## 2018-09-11 LAB — BASIC METABOLIC PANEL
Anion gap: 6 (ref 5–15)
BUN: 15 mg/dL (ref 6–20)
CHLORIDE: 102 mmol/L (ref 98–111)
CO2: 28 mmol/L (ref 22–32)
CREATININE: 0.82 mg/dL (ref 0.44–1.00)
Calcium: 8.7 mg/dL — ABNORMAL LOW (ref 8.9–10.3)
GFR calc non Af Amer: >60 mL/min (ref >60)
Glucose, Bld: 108 mg/dL — ABNORMAL HIGH (ref 70–99)
POTASSIUM: 4 mmol/L (ref 3.5–5.1)
SODIUM: 136 mmol/L (ref 135–145)

## 2018-09-11 LAB — MAGNESIUM: MAGNESIUM: 2.1 mg/dL (ref 1.7–2.4)
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
## 2018-09-11 NOTE — Telephone Encounter (Signed)
Left a vm for patient to callback regarding medication refills and appointment

## 2018-09-11 NOTE — Telephone Encounter (Signed)
-----   Message from Azzie Glatter, George sent at 09/04/2018  1:45 PM EDT ----- Regarding: "Refill on Atrovent Nasal Spray" Loraine Leriche to refill this medication. Please contact pharmacy to get last dosage that patient received....and approve.      Andria Frames,   Please contact patient have her to schedule follow up appointment within the next 3 months. We want to monitor Diabetes. She 'No-showed' her last appointment.    Thank you.

## 2018-09-11 NOTE — Progress Notes (Signed)
  Echocardiogram 2D Echocardiogram has been performed.  Hayley Alvarado 09/11/2018, 10:17 AM
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
## 2018-09-12 DIAGNOSIS — R262 Difficulty in walking, not elsewhere classified: Secondary | ICD-10-CM

## 2018-09-12 LAB — BASIC METABOLIC PANEL
Anion gap: 7 (ref 5–15)
BUN: 15 mg/dL (ref 6–20)
CHLORIDE: 101 mmol/L (ref 98–111)
CO2: 32 mmol/L (ref 22–32)
CREATININE: 0.86 mg/dL (ref 0.44–1.00)
Calcium: 9.2 mg/dL (ref 8.9–10.3)
Glucose, Bld: 114 mg/dL — ABNORMAL HIGH (ref 70–99)
Potassium: 4.6 mmol/L (ref 3.5–5.1)
Sodium: 140 mmol/L (ref 135–145)

## 2018-09-12 LAB — MAGNESIUM: Magnesium: 2 mg/dL (ref 1.7–2.4)

## 2018-09-12 LAB — GLUCOSE, CAPILLARY
GLUCOSE-CAPILLARY: 93 mg/dL (ref 70–99)
Glucose-Capillary: 154 mg/dL — ABNORMAL HIGH (ref 70–99)
Glucose-Capillary: 215 mg/dL — ABNORMAL HIGH (ref 70–99)

## 2018-09-12 MED ORDER — POTASSIUM CHLORIDE CRYS ER 20 MEQ PO TBCR: 20.0000 meq | EXTENDED_RELEASE_TABLET | Freq: Every day | ORAL | 0 refills | Status: AC

## 2018-09-12 MED ORDER — SENNOSIDES-DOCUSATE SODIUM 8.6-50 MG PO TABS: 1.0000 | ORAL_TABLET | Freq: Two times a day (BID) | ORAL | Status: AC

## 2018-09-12 MED ORDER — OXYCODONE HCL 10 MG PO TABS: 10.0000 mg | ORAL_TABLET | Freq: Four times a day (QID) | ORAL | 0 refills | Status: DC | PRN

## 2018-09-12 MED ORDER — MAGNESIUM OXIDE 400 (241.3 MG) MG PO TABS: 400.0000 mg | ORAL_TABLET | Freq: Two times a day (BID) | ORAL | 0 refills | Status: DC

## 2018-09-12 MED ORDER — ACETAMINOPHEN 500 MG PO TABS: 500.0000 mg | ORAL_TABLET | Freq: Three times a day (TID) | ORAL | 0 refills | Status: DC

## 2018-09-12 MED ORDER — PANTOPRAZOLE SODIUM 40 MG PO TBEC: 40.0000 mg | DELAYED_RELEASE_TABLET | Freq: Every day | ORAL | 0 refills | Status: DC

## 2018-09-12 MED ORDER — ACETAMINOPHEN 500 MG PO TABS
500.0000 mg | ORAL_TABLET | Freq: Three times a day (TID) | ORAL | Status: DC
  Administered 2018-09-12 (×2): 500 mg via ORAL
  Filled 2018-09-12: qty 1

## 2018-09-12 NOTE — Care Management Important Message (Signed)
Important Message  Patient Details  Name: Hayley Alvarado MRN: [id number] Date of Birth: 06-05-1956   Medicare Important Message Given:  Yes    Erenest Rasher, RN 09/12/2018, 1:52 PM

## 2018-09-12 NOTE — Care Management Note (Addendum)
Case Management Note  Patient Details  Name: Hayley Alvarado MRN: [id number] Date of Birth: 1955/11/20  Subjective/Objective:   Chronic osteoarthritis of both hips, pain                 Action/Plan: Please see previous NCM notes. Spoke to pt at bedside. Son and daughter assist at home. Contacted [facility] for wheelchair for home. Pt will have [facility] for HH. PTAR arranged.   Expected Discharge Date:  09/12/2018              Expected Discharge Plan:  [place]  In-House Referral:  NA  Discharge planning Services  CM Consult  Post Acute Care Choice:  Durable Medical Equipment(old rw) Choice offered to:  Patient  DME Arranged:  Wheelchair manual DME Agency:  [place]:  PT [facility] Agency:  [place]  Status of Service:  Completed, signed off  If discussed at [place] of Stay Meetings, dates discussed:    Additional Comments:  Erenest Rasher, RN 09/12/2018, 1:46 PM

## 2018-09-12 NOTE — Progress Notes (Signed)
..      Durable Medical Equipment  (From admission, onward)         Start     Ordered   09/11/18 2035  For home use only DME standard manual wheelchair with seat cushion  Once    Comments:  Patient suffers from severe bilateral osteoarthritis of the hips which impairs their ability to perform daily activities like ADLs in the home.  A walking aid will not resolve  issue with performing activities of daily living. A wheelchair will allow patient to safely perform daily activities. Patient can safely propel the wheelchair in the home or has a caregiver who can provide assistance.  Accessories: elevating leg rests (ELRs), wheel locks, extensions and anti-tippers.   Bariatric wheelchair   09/11/18 2035   09/10/18 1636  DME Glucometer  Once     09/10/18 1635          No charge
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
## 2018-09-16 NOTE — Telephone Encounter (Signed)
Please contact Pharmacy for dosage of Atrovent Nasal Spray X 12 refills. I am unable to approve refill request because I do not have correct dosage from previous refills.  Thanks.

## 2018-09-17 NOTE — Telephone Encounter (Signed)
That is the correct dose and refills have been called into pharmacy

## 2018-09-21 ENCOUNTER — Other Ambulatory Visit: Payer: Self-pay

## 2018-09-21 NOTE — Patient Outreach (Signed)
[facility]) Care Management  09/21/2018  Hayley Alvarado [birth year] [id number]   EMMI- General Discharge RED ON EMMI ALERT Day # 4 Date: 09/18/18 Red Alert Reason:  Read discharge papers? No  Know who to call about changes in condition? No  Scheduled follow-up? No  Unfilled prescriptions? Yes  Able to fill today/tomorrow? No    Outreach attempt: spoke with patient.  She is able to verify HIPAA.  Discussed reason for referral and red alerts. Patient states she has not had time to read discharge instructions.  Reviewed discharge instructions with patient.  Advised on importance of going over discharge instructions.  She verbalized understanding.  Patient says she was not sure who to call for changes.  Advised patient on contacting PCP for questions or concerns. She verbalized understanding and patient states she is calling today for appointments with PCP and Orthopedic.  Patient declined needing assistance as she needs to work around her family schedule.  Patient states that she does not want to run out of pain medications so she knows seeing her PCP and orthopedic are important. She says that her pain medication is working to lessen her pain. Patient states she has all her medications and is taking as prescribed.  Patient is appreciative of call and voices no further questions.  Plan: RN CM will close case at this time.   Jone Baseman, RN, MSN [facility] Care Management Care Management Coordinator Direct Line [phone] Toll Free: [phone]  Fax: [phone]

## 2018-09-30 ENCOUNTER — Ambulatory Visit (INDEPENDENT_AMBULATORY_CARE_PROVIDER_SITE_OTHER): Payer: Medicare Other | Admitting: Family Medicine

## 2018-09-30 ENCOUNTER — Encounter: Payer: Self-pay | Admitting: Family Medicine

## 2018-09-30 VITALS — BP 148/80 | HR 84 | Temp 98.7°F | Ht 64.0 in

## 2018-09-30 DIAGNOSIS — Z23 Encounter for immunization: Secondary | ICD-10-CM

## 2018-09-30 DIAGNOSIS — Z09 Encounter for follow-up examination after completed treatment for conditions other than malignant neoplasm: Secondary | ICD-10-CM

## 2018-09-30 DIAGNOSIS — I10 Essential (primary) hypertension: Secondary | ICD-10-CM | POA: Diagnosis not present

## 2018-09-30 DIAGNOSIS — M25551 Pain in right hip: Secondary | ICD-10-CM | POA: Diagnosis not present

## 2018-09-30 DIAGNOSIS — M25552 Pain in left hip: Secondary | ICD-10-CM

## 2018-09-30 DIAGNOSIS — E118 Type 2 diabetes mellitus with unspecified complications: Secondary | ICD-10-CM

## 2018-09-30 DIAGNOSIS — I890 Lymphedema, not elsewhere classified: Secondary | ICD-10-CM

## 2018-09-30 NOTE — Progress Notes (Addendum)
Hospital Follow Up  Subjective:    Patient ID: Hayley Alvarado, female    DOB: [birth year], 62 y.o.   MRN: [id number]   Chief Complaint  Patient presents with  . Hospitalization Follow-up   HPI  Hayley Alvarado is a 62 year old female with a past medical history of Diabetes, Osteoarthritis of Hips, Hypertension, Chronic PTSD, and Arthritis. She is here today for Hospital Follow Up.   Current Status: Since her last office visit, she is in wheelchair. She has chronic bilateral hip pain. She is waiting to be schedule for surgery. She denies visual changes, chest pain, cough, shortness of breath, heart palpitations, and falls. She has occasionally headaches and dizziness with position changes. Denies severe headaches, confusion, seizures, double vision, and blurred vision, nausea and vomiting.  She denies fatigue, blurred vision, excessive hunger, excessive thirst, weight gain, weight loss, and poor wound healing.   She denies fevers, chills, recent infections, weight loss, and night sweats. No reports of GI problems such as diarrhea, and constipation. She has no reports of blood in stools, dysuria and hematuria. No depression or anxiety reported.   Past Medical History:  Diagnosis Date  . Arthritis   . Chronic post-traumatic stress disorder 09/19/2014  . Essential hypertension 02/03/2017  . Hypertension   . Microcytic hypochromic anemia 09/18/2015  . Osteoarthritis of hips, bilateral 09/08/2018  . Type 2 diabetes mellitus, uncontrolled ([place]) 04/06/2015   Family History  Problem Relation Age of Onset  . Diabetes Other     Social History   Socioeconomic History  . Marital status: Single    Spouse name: Not on file  . Number of children: Not on file  . Years of education: Not on file  . Highest education level: Not on file  Occupational History  . Not on file  Social Needs  . Financial resource strain: Not on file  . Food insecurity:    Worry: Not on file    Inability: Not on file  .  Transportation needs:    Medical: Not on file    Non-medical: Not on file  Tobacco Use  . Smoking status: Never Smoker  . Smokeless tobacco: Never Used  Substance and Sexual Activity  . Alcohol use: No  . Drug use: No  . Sexual activity: Not on file  Lifestyle  . Physical activity:    Days per week: Not on file    Minutes per session: Not on file  . Stress: Not on file  Relationships  . Social connections:    Talks on phone: Not on file    Gets together: Not on file    Attends religious service: Not on file    Active member of club or organization: Not on file    Attends meetings of clubs or organizations: Not on file    Relationship status: Not on file  . Intimate partner violence:    Fear of current or ex partner: Not on file    Emotionally abused: Not on file    Physically abused: Not on file    Forced sexual activity: Not on file  Other Topics Concern  . Not on file  Social History Narrative  . Not on file    Past Surgical History:  Procedure Laterality Date  . btl    . KNEE SURGERY    . SKIN GRAFT    . TUBAL LIGATION      Immunization History  Administered Date(s) Administered  . Influenza, Seasonal, Injecte, Preservative Fre 09/19/2014  .  Influenza,inj,Quad PF,6+ Mos 09/15/2015, 01/31/2017, 09/30/2018  . Influenza-Unspecified 01/31/2017  . Pneumococcal Polysaccharide-23 04/18/2016  . Tdap 04/18/2016, 01/31/2017    Current Meds  Medication Sig  . acetaminophen (TYLENOL) 500 MG tablet Take 1 tablet (500 mg total) by mouth 3 (three) times daily.  . ferrous sulfate 325 (65 FE) MG tablet Take by mouth.  . fluticasone (FLONASE) 50 MCG/ACT nasal spray Place 2 sprays into both nostrils daily.   Marland Kitchen gabapentin (NEURONTIN) 400 MG capsule TAKE ONE CAPSULE BY MOUTH 4 TIMES A DAY  . hydrochlorothiazide (HYDRODIURIL) 25 MG tablet Take 1 tablet (25 mg total) by mouth daily.  . magnesium oxide (MAG-OX) 400 (241.3 Mg) MG tablet Take 1 tablet (400 mg total) by mouth 2  (two) times daily.  . metFORMIN (GLUCOPHAGE) 500 MG tablet Take 1 tablet (500 mg total) by mouth 2 (two) times daily with a meal.  . methocarbamol (ROBAXIN) 500 MG tablet Take 1 tablet (500 mg total) by mouth every 8 (eight) hours as needed for muscle spasms.  Marland Kitchen oxyCODONE 10 MG TABS Take 1 tablet (10 mg total) by mouth every 6 (six) hours as needed for moderate pain or breakthrough pain.  . pantoprazole (PROTONIX) 40 MG tablet Take 1 tablet (40 mg total) by mouth daily.  . potassium chloride SA (K-DUR,KLOR-CON) 20 MEQ tablet Take 1 tablet (20 mEq total) by mouth daily.  Marland Kitchen senna-docusate (SENOKOT S) 8.6-50 MG tablet Take 1 tablet by mouth 2 (two) times daily.    Allergies  Allergen Reactions  . Lisinopril Cough  . Morphine And Related Other (See Comments)    flushing  . Verapamil Other (See Comments)  . Morphine Other (See Comments)    flushing    BP (!) 148/80 (BP Location: Left Wrist, Patient Position: Sitting, Cuff Size: Large)   Pulse 84   Temp 98.7 F (37.1 C) (Oral)   Ht 5\' 4"  (1.626 m)   SpO2 96%   BMI 43.46 kg/m     Review of Systems  Constitutional: Negative.   HENT: Negative.   Eyes: Negative.   Respiratory: Negative.   Cardiovascular: Negative.   Gastrointestinal: Negative.   Endocrine: Negative.   Genitourinary: Negative.   Musculoskeletal: Positive for arthralgias (Generalized ).  Skin: Negative.   Allergic/Immunologic: Negative.   Neurological: Positive for dizziness and headaches.  Hematological: Negative.   Psychiatric/Behavioral: Negative.    Objective:   Physical Exam  Constitutional: She is oriented to person, place, and time. She appears well-developed and well-nourished.  HENT:  Head: Normocephalic and atraumatic.  Eyes: Pupils are equal, round, and reactive to light. Conjunctivae and EOM are normal.  Neck: Normal range of motion. Neck supple.  Cardiovascular: Normal rate, regular rhythm, normal heart sounds and intact distal pulses.   Pulmonary/Chest: Effort normal and breath sounds normal.  Abdominal: Soft. Bowel sounds are normal. She exhibits distension (Obese).  Musculoskeletal: Normal range of motion.  Neurological: She is alert and oriented to person, place, and time.  Skin: Skin is warm and dry.  Bilateral lower extremity lymphedema.   Psychiatric: She has a normal mood and affect. Her behavior is normal. Judgment and thought content nor    mal.  Nursing note and vitals reviewed.  Assessment & Plan:   1. Type 2 diabetes mellitus with complication, without long-term current use of insulin (HCC) Hgb A1c is decreased at 7.0, from 7.2, from 7.2 on 04/15/2018. Continue Metformin a prescribed. She will continue to decrease foods/beverages high in sugars and carbs and follow Heart Healthy  or DASH diet. Increase physical activity to at least 30 minutes cardio exercise daily.  - magnesium oxide (MAG-OX) 400 (241.3 Mg) MG tablet; Take 1 tablet (400 mg total) by mouth 2 (two) times daily.  Dispense: 30 tablet; Refill: 0  2. Essential hypertension Blood pressure is at 148/80 today. Continue HCTZ as prescribed. She will continue to decrease high sodium intake, excessive alcohol intake, increase potassium intake, smoking cessation, and increase physical activity of at least 30 minutes of cardio activity daily. She will continue to follow Heart Healthy or DASH diet.  3. Bilateral hip pain - methocarbamol (ROBAXIN) 500 MG tablet; Take 1 tablet (500 mg total) by mouth every 8 (eight) hours as needed for muscle spasms.  Dispense: 60 tablet; Refill: 3 - gabapentin (NEURONTIN) 400 MG capsule; TAKE ONE CAPSULE BY MOUTH 4 TIMES A DAY  Dispense: 120 capsule; Refill: 3 - ketorolac (TORADOL) injection 60 mg  4. Lymphedema of both lower extremities - Ambulatory referral to Wound Clinic  5. Need for immunization against influenza - Flu Vaccine QUAD 36+ mos IM  6. Follow up She will follow up in 3 months.   Meds ordered this  encounter  Medications  . methocarbamol (ROBAXIN) 500 MG tablet    Sig: Take 1 tablet (500 mg total) by mouth every 8 (eight) hours as needed for muscle spasms.    Dispense:  60 tablet    Refill:  3  . gabapentin (NEURONTIN) 400 MG capsule    Sig: TAKE ONE CAPSULE BY MOUTH 4 TIMES A DAY    Dispense:  120 capsule    Refill:  3  . magnesium oxide (MAG-OX) 400 (241.3 Mg) MG tablet    Sig: Take 1 tablet (400 mg total) by mouth 2 (two) times daily.    Dispense:  30 tablet    Refill:  0  . ketorolac (TORADOL) injection 60 mg    Kathe Becton,  MSN, FNP-C Patient [facility] [address] [place], [place] [address] [phone]

## 2018-10-01 DIAGNOSIS — M25551 Pain in right hip: Secondary | ICD-10-CM | POA: Diagnosis not present

## 2018-10-01 DIAGNOSIS — M25552 Pain in left hip: Secondary | ICD-10-CM | POA: Diagnosis not present

## 2018-10-02 MED ORDER — KETOROLAC TROMETHAMINE 60 MG/2ML IM SOLN
60.0000 mg | Freq: Once | INTRAMUSCULAR | Status: AC
  Administered 2018-10-01: 60 mg via INTRAMUSCULAR

## 2018-10-02 MED ORDER — METHOCARBAMOL 500 MG PO TABS: 500.0000 mg | ORAL_TABLET | Freq: Three times a day (TID) | ORAL | 3 refills | Status: DC | PRN

## 2018-10-02 MED ORDER — GABAPENTIN 400 MG PO CAPS: ORAL_CAPSULE | ORAL | 3 refills | Status: AC

## 2018-10-02 MED ORDER — MAGNESIUM OXIDE 400 (241.3 MG) MG PO TABS: 400.0000 mg | ORAL_TABLET | Freq: Two times a day (BID) | ORAL | 0 refills | Status: DC

## 2018-10-13 ENCOUNTER — Telehealth: Payer: Self-pay

## 2018-10-13 MED ORDER — OXYCODONE HCL 10 MG PO TABS: 10.0000 mg | ORAL_TABLET | Freq: Four times a day (QID) | ORAL | 0 refills | Status: AC | PRN

## 2018-10-13 NOTE — Telephone Encounter (Signed)
Refilled for 1 week.

## 2018-10-21 ENCOUNTER — Encounter (HOSPITAL_BASED_OUTPATIENT_CLINIC_OR_DEPARTMENT_OTHER): Payer: Medicare Other | Attending: Internal Medicine

## 2018-10-25 ENCOUNTER — Other Ambulatory Visit: Payer: Self-pay | Admitting: Family Medicine

## 2018-10-25 DIAGNOSIS — E118 Type 2 diabetes mellitus with unspecified complications: Secondary | ICD-10-CM

## 2018-10-25 DIAGNOSIS — I10 Essential (primary) hypertension: Secondary | ICD-10-CM

## 2018-10-25 MED ORDER — ATORVASTATIN CALCIUM 10 MG PO TABS: 10.0000 mg | ORAL_TABLET | Freq: Every day | ORAL | 3 refills | Status: DC

## 2018-10-25 NOTE — Progress Notes (Signed)
New Rx for Atorvastatin sent to pharmacy today.

## 2018-11-09 ENCOUNTER — Other Ambulatory Visit: Payer: Self-pay | Admitting: Family Medicine

## 2018-11-09 ENCOUNTER — Telehealth: Payer: Self-pay

## 2018-11-09 DIAGNOSIS — E118 Type 2 diabetes mellitus with unspecified complications: Secondary | ICD-10-CM

## 2018-11-09 MED ORDER — METFORMIN HCL 500 MG PO TABS: 500.0000 mg | ORAL_TABLET | Freq: Two times a day (BID) | ORAL | 3 refills | Status: AC

## 2018-11-10 ENCOUNTER — Telehealth: Payer: Self-pay

## 2018-11-10 ENCOUNTER — Other Ambulatory Visit: Payer: Self-pay | Admitting: Family Medicine

## 2018-11-10 DIAGNOSIS — M25551 Pain in right hip: Secondary | ICD-10-CM

## 2018-11-10 DIAGNOSIS — M25552 Pain in left hip: Secondary | ICD-10-CM

## 2018-11-10 DIAGNOSIS — R21 Rash and other nonspecific skin eruption: Secondary | ICD-10-CM

## 2018-11-10 MED ORDER — MELOXICAM 15 MG PO TABS: 15.0000 mg | ORAL_TABLET | Freq: Every day | ORAL | 3 refills | Status: AC

## 2018-11-10 MED ORDER — TRIAMCINOLONE ACETONIDE 0.1 % EX CREA: 1.0000 "application " | TOPICAL_CREAM | Freq: Two times a day (BID) | CUTANEOUS | 3 refills | Status: AC

## 2018-11-10 NOTE — Telephone Encounter (Signed)
Patient would like to have Meloxicam sent in for pain. She wasn't sure what the name of the cream was for her face and I couldn't find it in the chart.

## 2018-12-03 ENCOUNTER — Other Ambulatory Visit: Payer: Self-pay | Admitting: Family Medicine

## 2018-12-03 ENCOUNTER — Telehealth: Payer: Self-pay

## 2018-12-03 NOTE — Telephone Encounter (Signed)
[employer]
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
## 2018-12-03 NOTE — Progress Notes (Signed)
Letter to [employer] written today.

## 2018-12-03 NOTE — Telephone Encounter (Signed)
Hayley Corrigan- [phone]

## 2018-12-19 ENCOUNTER — Other Ambulatory Visit: Payer: Self-pay

## 2018-12-19 ENCOUNTER — Emergency Department (HOSPITAL_COMMUNITY): Payer: Medicare Other

## 2018-12-19 ENCOUNTER — Inpatient Hospital Stay (HOSPITAL_COMMUNITY)
Admission: EM | Admit: 2018-12-19 | Discharge: 2018-12-25 | DRG: 554 | Disposition: A | Discharge status: Skilled Nursing Facility | Payer: Medicare Other | Attending: Internal Medicine | Admitting: Internal Medicine

## 2018-12-19 DIAGNOSIS — Z885 Allergy status to narcotic agent status: Secondary | ICD-10-CM

## 2018-12-19 DIAGNOSIS — F4312 Post-traumatic stress disorder, chronic: Secondary | ICD-10-CM | POA: Diagnosis present

## 2018-12-19 DIAGNOSIS — R52 Pain, unspecified: Secondary | ICD-10-CM

## 2018-12-19 DIAGNOSIS — R000 Tachycardia, unspecified: Secondary | ICD-10-CM | POA: Diagnosis not present

## 2018-12-19 DIAGNOSIS — L8990 Pressure ulcer of unspecified site, unspecified stage: Secondary | ICD-10-CM

## 2018-12-19 DIAGNOSIS — M25551 Pain in right hip: Secondary | ICD-10-CM | POA: Diagnosis not present

## 2018-12-19 DIAGNOSIS — I890 Lymphedema, not elsewhere classified: Secondary | ICD-10-CM | POA: Diagnosis present

## 2018-12-19 DIAGNOSIS — Z96653 Presence of artificial knee joint, bilateral: Secondary | ICD-10-CM | POA: Diagnosis present

## 2018-12-19 DIAGNOSIS — L89150 Pressure ulcer of sacral region, unstageable: Secondary | ICD-10-CM | POA: Diagnosis not present

## 2018-12-19 DIAGNOSIS — M160 Bilateral primary osteoarthritis of hip: Secondary | ICD-10-CM | POA: Diagnosis not present

## 2018-12-19 DIAGNOSIS — D509 Iron deficiency anemia, unspecified: Secondary | ICD-10-CM | POA: Diagnosis present

## 2018-12-19 DIAGNOSIS — I10 Essential (primary) hypertension: Secondary | ICD-10-CM | POA: Diagnosis present

## 2018-12-19 DIAGNOSIS — D72829 Elevated white blood cell count, unspecified: Secondary | ICD-10-CM

## 2018-12-19 DIAGNOSIS — Z862 Personal history of diseases of the blood and blood-forming organs and certain disorders involving the immune mechanism: Secondary | ICD-10-CM

## 2018-12-19 DIAGNOSIS — E1165 Type 2 diabetes mellitus with hyperglycemia: Secondary | ICD-10-CM | POA: Diagnosis present

## 2018-12-19 DIAGNOSIS — R531 Weakness: Secondary | ICD-10-CM | POA: Diagnosis not present

## 2018-12-19 DIAGNOSIS — L89152 Pressure ulcer of sacral region, stage 2: Secondary | ICD-10-CM | POA: Diagnosis present

## 2018-12-19 DIAGNOSIS — L89312 Pressure ulcer of right buttock, stage 2: Secondary | ICD-10-CM | POA: Diagnosis present

## 2018-12-19 DIAGNOSIS — M25559 Pain in unspecified hip: Secondary | ICD-10-CM | POA: Diagnosis not present

## 2018-12-19 DIAGNOSIS — Z20828 Contact with and (suspected) exposure to other viral communicable diseases: Secondary | ICD-10-CM | POA: Diagnosis present

## 2018-12-19 DIAGNOSIS — L989 Disorder of the skin and subcutaneous tissue, unspecified: Secondary | ICD-10-CM | POA: Diagnosis not present

## 2018-12-19 DIAGNOSIS — R0902 Hypoxemia: Secondary | ICD-10-CM | POA: Diagnosis not present

## 2018-12-19 DIAGNOSIS — L89322 Pressure ulcer of left buttock, stage 2: Secondary | ICD-10-CM | POA: Diagnosis present

## 2018-12-19 DIAGNOSIS — Z6841 Body Mass Index (BMI) 40.0 and over, adult: Secondary | ICD-10-CM | POA: Diagnosis not present

## 2018-12-19 DIAGNOSIS — Z833 Family history of diabetes mellitus: Secondary | ICD-10-CM

## 2018-12-19 DIAGNOSIS — M25552 Pain in left hip: Secondary | ICD-10-CM

## 2018-12-19 DIAGNOSIS — Z791 Long term (current) use of non-steroidal anti-inflammatories (NSAID): Secondary | ICD-10-CM

## 2018-12-19 DIAGNOSIS — Z888 Allergy status to other drugs, medicaments and biological substances status: Secondary | ICD-10-CM

## 2018-12-19 DIAGNOSIS — Z993 Dependence on wheelchair: Secondary | ICD-10-CM

## 2018-12-19 DIAGNOSIS — Z7951 Long term (current) use of inhaled steroids: Secondary | ICD-10-CM

## 2018-12-19 DIAGNOSIS — Z79899 Other long term (current) drug therapy: Secondary | ICD-10-CM

## 2018-12-19 DIAGNOSIS — IMO0002 Reserved for concepts with insufficient information to code with codable children: Secondary | ICD-10-CM | POA: Diagnosis present

## 2018-12-19 DIAGNOSIS — Z7984 Long term (current) use of oral hypoglycemic drugs: Secondary | ICD-10-CM

## 2018-12-19 DIAGNOSIS — L89890 Pressure ulcer of other site, unstageable: Secondary | ICD-10-CM | POA: Diagnosis present

## 2018-12-19 LAB — URINALYSIS, ROUTINE W REFLEX MICROSCOPIC
Bilirubin Urine: NEGATIVE
Glucose, UA: NEGATIVE mg/dL
Ketones, ur: NEGATIVE mg/dL
Nitrite: NEGATIVE
Protein, ur: NEGATIVE mg/dL
Specific Gravity, Urine: 1.024 (ref 1.005–1.030)
pH: 5 (ref 5.0–8.0)

## 2018-12-19 LAB — CBC WITH DIFFERENTIAL/PLATELET
Abs Immature Granulocytes: 0.03 10*3/uL (ref 0.00–0.07)
Basophils Absolute: 0.1 10*3/uL (ref 0.0–0.1)
Basophils Relative: 0 %
EOS PCT: 3 %
Eosinophils Absolute: 0.4 10*3/uL (ref 0.0–0.5)
HCT: 34.5 % — ABNORMAL LOW (ref 36.0–46.0)
Hemoglobin: 10.3 g/dL — ABNORMAL LOW (ref 12.0–15.0)
Immature Granulocytes: 0 %
Lymphocytes Relative: 13 %
Lymphs Abs: 1.6 10*3/uL (ref 0.7–4.0)
MCH: 21.1 pg — ABNORMAL LOW (ref 26.0–34.0)
MCHC: 29.9 g/dL — AB (ref 30.0–36.0)
MCV: 70.8 fL — ABNORMAL LOW (ref 80.0–100.0)
Monocytes Absolute: 0.9 10*3/uL (ref 0.1–1.0)
Monocytes Relative: 8 %
Neutro Abs: 9.3 10*3/uL — ABNORMAL HIGH (ref 1.7–7.7)
Neutrophils Relative %: 76 %
Platelets: 198 10*3/uL (ref 150–400)
RBC: 4.87 MIL/uL (ref 3.87–5.11)
RDW: 15.9 % — AB (ref 11.5–15.5)
WBC: 12.3 10*3/uL — ABNORMAL HIGH (ref 4.0–10.5)
nRBC: 0 % (ref 0.0–0.2)

## 2018-12-19 LAB — BASIC METABOLIC PANEL
Anion gap: 14 (ref 5–15)
BUN: 23 mg/dL — ABNORMAL HIGH (ref 6–20)
CO2: 27 mmol/L (ref 22–32)
Calcium: 9.1 mg/dL (ref 8.9–10.3)
Chloride: 97 mmol/L — ABNORMAL LOW (ref 98–111)
Creatinine, Ser: 0.77 mg/dL (ref 0.44–1.00)
GFR calc Af Amer: >60 mL/min (ref >60)
GFR calc non Af Amer: >60 mL/min (ref >60)
Glucose, Bld: 119 mg/dL — ABNORMAL HIGH (ref 70–99)
Potassium: 4.8 mmol/L (ref 3.5–5.1)
Sodium: 138 mmol/L (ref 135–145)

## 2018-12-19 LAB — HEPATIC FUNCTION PANEL
ALT: 10 U/L (ref 0–44)
AST: 27 U/L (ref 15–41)
Albumin: 3.5 g/dL (ref 3.5–5.0)
Alkaline Phosphatase: 77 U/L (ref 38–126)
Bilirubin, Direct: 0.5 mg/dL — ABNORMAL HIGH (ref 0.0–0.2)
Indirect Bilirubin: 0.8 mg/dL (ref 0.3–0.9)
Total Bilirubin: 1.3 mg/dL — ABNORMAL HIGH (ref 0.3–1.2)
Total Protein: 7.3 g/dL (ref 6.5–8.1)

## 2018-12-19 LAB — CBG MONITORING, ED: GLUCOSE-CAPILLARY: 86 mg/dL (ref 70–99)

## 2018-12-19 LAB — I-STAT TROPONIN, ED: Troponin i, poc: 0 ng/mL (ref 0.00–0.08)

## 2018-12-19 MED ORDER — FLUTICASONE PROPIONATE 50 MCG/ACT NA SUSP
2.0000 | Freq: Every day | NASAL | Status: DC
  Administered 2018-12-20 – 2018-12-25 (×6): 2 via NASAL
  Filled 2018-12-19: qty 16

## 2018-12-19 MED ORDER — FERROUS SULFATE 325 (65 FE) MG PO TABS
325.0000 mg | ORAL_TABLET | Freq: Two times a day (BID) | ORAL | Status: DC
  Administered 2018-12-20 – 2018-12-25 (×11): 325 mg via ORAL
  Filled 2018-12-19 (×10): qty 1

## 2018-12-19 MED ORDER — GABAPENTIN 400 MG PO CAPS
400.0000 mg | ORAL_CAPSULE | Freq: Four times a day (QID) | ORAL | Status: DC
  Administered 2018-12-20 – 2018-12-25 (×23): 400 mg via ORAL
  Filled 2018-12-19 (×23): qty 1

## 2018-12-19 MED ORDER — KETOROLAC TROMETHAMINE 30 MG/ML IJ SOLN
30.0000 mg | Freq: Four times a day (QID) | INTRAMUSCULAR | Status: AC | PRN
  Administered 2018-12-20 – 2018-12-24 (×10): 30 mg via INTRAVENOUS
  Filled 2018-12-19 (×10): qty 1

## 2018-12-19 MED ORDER — ACETAMINOPHEN 325 MG PO TABS
650.0000 mg | ORAL_TABLET | Freq: Four times a day (QID) | ORAL | Status: DC | PRN
  Administered 2018-12-20 – 2018-12-23 (×2): 650 mg via ORAL
  Filled 2018-12-19 (×2): qty 2

## 2018-12-19 MED ORDER — SENNOSIDES-DOCUSATE SODIUM 8.6-50 MG PO TABS
1.0000 | ORAL_TABLET | Freq: Two times a day (BID) | ORAL | Status: DC
  Administered 2018-12-20 – 2018-12-25 (×11): 1 via ORAL
  Filled 2018-12-19 (×11): qty 1

## 2018-12-19 MED ORDER — ENOXAPARIN SODIUM 40 MG/0.4ML ~~LOC~~ SOLN
40.0000 mg | SUBCUTANEOUS | Status: DC
  Administered 2018-12-20 – 2018-12-25 (×6): 40 mg via SUBCUTANEOUS
  Filled 2018-12-19 (×6): qty 0.4

## 2018-12-19 MED ORDER — ACETAMINOPHEN 650 MG RE SUPP: 650.0000 mg | Freq: Four times a day (QID) | RECTAL | Status: DC | PRN

## 2018-12-19 MED ORDER — ONDANSETRON HCL 4 MG PO TABS: 4.0000 mg | ORAL_TABLET | Freq: Four times a day (QID) | ORAL | Status: DC | PRN

## 2018-12-19 MED ORDER — HYDROCHLOROTHIAZIDE 25 MG PO TABS
25.0000 mg | ORAL_TABLET | Freq: Every day | ORAL | Status: DC
  Administered 2018-12-20 – 2018-12-25 (×6): 25 mg via ORAL
  Filled 2018-12-19 (×6): qty 1

## 2018-12-19 MED ORDER — ONDANSETRON HCL 4 MG/2ML IJ SOLN
4.0000 mg | Freq: Four times a day (QID) | INTRAMUSCULAR | Status: DC | PRN
  Administered 2018-12-20: 4 mg via INTRAVENOUS
  Filled 2018-12-19: qty 2

## 2018-12-19 MED ORDER — METFORMIN HCL 500 MG PO TABS
500.0000 mg | ORAL_TABLET | Freq: Two times a day (BID) | ORAL | Status: DC
  Administered 2018-12-20 – 2018-12-22 (×5): 500 mg via ORAL
  Filled 2018-12-19 (×5): qty 1

## 2018-12-19 MED ORDER — POTASSIUM CHLORIDE CRYS ER 20 MEQ PO TBCR
20.0000 meq | EXTENDED_RELEASE_TABLET | Freq: Every day | ORAL | Status: DC
  Administered 2018-12-20 – 2018-12-25 (×6): 20 meq via ORAL
  Filled 2018-12-19 (×6): qty 1

## 2018-12-19 NOTE — ED Provider Notes (Signed)
[facility] EMERGENCY DEPARTMENT Provider Note   CSN: [id number] Arrival date & time: 12/19/18  1811     History   Chief Complaint Chief Complaint  Patient presents with  . Hip Pain    HPI Hayley Alvarado is a 63 y.o. female.  63 y/o female with an extensive PMH of HTN, T2DM, PTSD, Lymphedema presents to the ED with a chief complaint of BL hip pain x few months.  She reports this hip pain is chronic for her, she was supposed to be scheduled for a hip replacement a few months ago, but reports she was unable to have this scheduled due to her living situation.  Patient has been taking Tylenol extra strength every 6 hours, along with 50 mg of meloxicam.  She has been seen in the past by Dr. Grandville Silos and Dr. Lucia Gaskins of [facility] reports she had some injections to the hips.  She received fentanyl 200 mics via EMS.  She also noted a wound to her right lower leg x2 weeks, reports this is due to her sitting in the recliner.  Patient has gone from ambulating with a cane to ambulating with a walker to wheelchair bound and now reports that she is nonambulatory at home.  She also reports some fever with a T-max of 102 yesterday.  She denies any shortness of breath, reports chest pain when trying to transfer from one chair to the other.  Denies any trauma.     Past Medical History:  Diagnosis Date  . Arthritis   . Chronic post-traumatic stress disorder 09/19/2014  . Essential hypertension 02/03/2017  . Hypertension   . Microcytic hypochromic anemia 09/18/2015  . Osteoarthritis of hips, bilateral 09/08/2018  . Type 2 diabetes mellitus, uncontrolled ([place]) 04/06/2015    Patient Active Problem List   Diagnosis Date Noted  . Hypokalemia 09/08/2018  . Osteoarthritis of hips, bilateral 09/08/2018  . Hypertensive urgency 09/08/2018  . Essential hypertension 02/03/2017  . Bilateral hip pain 02/03/2017  . Elevated hemoglobin A1c 02/03/2017  . Hx of iron deficiency anemia  02/03/2017  . Lymphedema 02/03/2017  . Microcytic hypochromic anemia 09/18/2015  . Body mass index 40.0-44.9, adult ([place]) 04/06/2015  . Type 2 diabetes mellitus, uncontrolled ([place]) 04/06/2015  . Artificial knee joint present, bilateral 09/19/2014  . Chronic post-traumatic stress disorder 09/19/2014  . Lymphedema of both lower extremities 09/19/2014    Past Surgical History:  Procedure Laterality Date  . btl    . KNEE SURGERY    . SKIN GRAFT    . TUBAL LIGATION       OB History   No obstetric history on file.      Home Medications    Prior to Admission medications   Medication Sig Start Date End Date Taking? Authorizing Provider  acetaminophen (TYLENOL) 500 MG tablet Take 1 tablet (500 mg total) by mouth 3 (three) times daily. 09/12/18   Eugenie Filler, MD  atorvastatin (LIPITOR) 10 MG tablet Take 1 tablet (10 mg total) by mouth daily. 10/25/18 02/22/19  Azzie Glatter, FNP  ferrous sulfate 325 (65 FE) MG tablet Take by mouth. 04/18/16   [provider]  fluticasone (FLONASE) 50 MCG/ACT nasal spray Place 2 sprays into both nostrils daily.  04/18/16   [provider]  gabapentin (NEURONTIN) 400 MG capsule TAKE ONE CAPSULE BY MOUTH 4 TIMES A DAY 10/02/18   Azzie Glatter, FNP  hydrochlorothiazide (HYDRODIURIL) 25 MG tablet Take 1 tablet (25 mg total) by mouth daily.  04/28/18   Azzie Glatter, FNP  magnesium oxide (MAG-OX) 400 (241.3 Mg) MG tablet Take 1 tablet (400 mg total) by mouth 2 (two) times daily. 10/02/18   Azzie Glatter, FNP  meloxicam (MOBIC) 15 MG tablet Take 1 tablet (15 mg total) by mouth daily. 11/10/18   Azzie Glatter, FNP  metFORMIN (GLUCOPHAGE) 500 MG tablet Take 1 tablet (500 mg total) by mouth 2 (two) times daily with a meal. 11/09/18   Azzie Glatter, FNP  methocarbamol (ROBAXIN) 500 MG tablet Take 1 tablet (500 mg total) by mouth every 8 (eight) hours as needed for muscle spasms. 10/02/18   Azzie Glatter, FNP  pantoprazole  (PROTONIX) 40 MG tablet Take 1 tablet (40 mg total) by mouth daily. 09/12/18 09/12/19  Eugenie Filler, MD  potassium chloride SA (K-DUR,KLOR-CON) 20 MEQ tablet Take 1 tablet (20 mEq total) by mouth daily. 09/13/18   Eugenie Filler, MD  senna-docusate (SENOKOT S) 8.6-50 MG tablet Take 1 tablet by mouth 2 (two) times daily. 09/12/18   Eugenie Filler, MD  triamcinolone cream (KENALOG) 0.1 % Apply 1 application topically 2 (two) times daily. 11/10/18   Azzie Glatter, FNP    Family History Family History  Problem Relation Age of Onset  . Diabetes Other     Social History Social History   Tobacco Use  . Smoking status: Never Smoker  . Smokeless tobacco: Never Used  Substance Use Topics  . Alcohol use: No  . Drug use: No     Allergies   Lisinopril; Morphine and related; Verapamil; and Morphine   Review of Systems Review of Systems  Constitutional: Positive for fever. Negative for chills.  HENT: Negative for ear pain and sore throat.   Eyes: Negative for pain and visual disturbance.  Respiratory: Negative for cough and shortness of breath.   Cardiovascular: Negative for chest pain and palpitations.  Gastrointestinal: Negative for abdominal pain and vomiting.  Genitourinary: Negative for dysuria and hematuria.  Musculoskeletal: Positive for arthralgias. Negative for back pain.  Skin: Positive for wound. Negative for color change and rash.  Neurological: Negative for seizures and syncope.  All other systems reviewed and are negative.    Physical Exam Updated Vital Signs BP (!) 148/77   Pulse 95   Temp 98.6 F (37 C) (Oral)   Resp 18   Ht 5\' 4"  (1.626 m)   Wt 116.1 kg   SpO2 98%   BMI 43.94 kg/m   Physical Exam Vitals signs and nursing note reviewed.  Constitutional:      General: She is not in acute distress.    Appearance: She is well-developed. She is obese. She is ill-appearing.  HENT:     Head: Normocephalic and atraumatic.     Mouth/Throat:      Pharynx: No oropharyngeal exudate.  Eyes:     Pupils: Pupils are equal, round, and reactive to light.  Neck:     Musculoskeletal: Normal range of motion.  Cardiovascular:     Rate and Rhythm: Regular rhythm.     Heart sounds: Normal heart sounds.  Pulmonary:     Effort: Pulmonary effort is normal. No respiratory distress.     Breath sounds: Normal breath sounds.  Abdominal:     General: Bowel sounds are normal. There is no distension.     Palpations: Abdomen is soft.     Tenderness: There is no abdominal tenderness.  Musculoskeletal:        General: No tenderness  or deformity.     Right lower leg: No edema.     Left lower leg: No edema.  Skin:    General: Skin is warm and dry.     Findings: Lesion present.       Neurological:     Mental Status: She is alert and oriented to person, place, and time.          ED Treatments / Results  Labs (all labs ordered are listed, but only abnormal results are displayed) Labs Reviewed  BASIC METABOLIC PANEL - Abnormal; Notable for the following components:      Result Value   Chloride 97 (*)    Glucose, Bld 119 (*)    BUN 23 (*)    All other components within normal limits  CBC WITH DIFFERENTIAL/PLATELET - Abnormal; Notable for the following components:   WBC 12.3 (*)    Hemoglobin 10.3 (*)    HCT 34.5 (*)    MCV 70.8 (*)    MCH 21.1 (*)    MCHC 29.9 (*)    RDW 15.9 (*)    Neutro Abs 9.3 (*)    All other components within normal limits  HEPATIC FUNCTION PANEL - Abnormal; Notable for the following components:   Total Bilirubin 1.3 (*)    Bilirubin, Direct 0.5 (*)    All other components within normal limits  URINALYSIS, ROUTINE W REFLEX MICROSCOPIC  I-STAT TROPONIN, ED  CBG MONITORING, ED    EKG None  Radiology No results found.  Procedures Procedures (including critical care time)  Medications Ordered in ED Medications - No data to display   Initial Impression / Assessment and Plan / ED Course  I have  reviewed the triage vital signs and the nursing notes.  Pertinent labs & imaging results that were available during my care of the patient were reviewed by me and considered in my medical decision making (see chart for details).    Patient presents to the ED with a chief complaint of chronic bilateral hip pain times a few months.  Patient has been seen by [facility] previously and was scheduled for hip replacement but reports these plans had to be rearranged due to her living situation.  Today she reports the pain has gotten worse, received 200 mics of fentanyl via EMS.  Patient has gone from walking with cane assistance to being nonambulatory, reports she sits in a recliner most of the day and does not move.  He also endorses a wound to her left lower leg, reports this has been there for 2 weeks, states she has some subjective fevers yesterday with a Tmax of 102.   DVC showed leukocytosis at 12.1, hemoglobin is decreased but consistent with patient's previous visits.  BMP showed slight decrease in chloride, glucose is elevated and BUN is 23, previous history of type II diabetic.  First troponin was negative, CBG was checked due to patient requesting food during arrival, 86.  At this time due to patient's weakness, inability to ambulate along with wounds believe patient would benefit from inpatient hospitalist.  Will place call for hospitalist for further admission.  9:08 PM Spoke to Dr. Alcario Drought who patient advised patient might benefit from case management. A consult was placed with social workers currently not in the ED at this time.  Dr. Alcario Drought did see patient in the ED and states patient ultimately would like a hip replacement, she has been denied previously due to BMI.  Patient care transferred to Dr. Francia Greaves at  shift change.   Final Clinical Impressions(s) / ED Diagnoses   Final diagnoses:  Weakness  Wound infection    ED Discharge Orders    None       Janeece Fitting,  PA-C 12/19/18 2200    Valarie Merino, MD 12/19/18 2345

## 2018-12-19 NOTE — H&P (Addendum)
History and Physical    Hayley Alvarado OAC:[id number] DOB: 13-Dec-1955 DOA: 12/19/2018  PCP: Azzie Glatter, FNP  Patient coming from: Home  I have personally briefly reviewed patient's old medical records in [place]  Chief Complaint: Hip pain  HPI: Hayley Alvarado is a 63 y.o. female with medical history significant of HTN, DM2, Lymphedema of BLE.  Patient presents to ED with worsening B hip pain for past several months.  She has known severe arthritis of both hips.  Needs hip replacement, but ortho surgery feels that she is not a candidate due to BMI > 40 and lymphedema.  She has progressed from walking with cane, to walker, to wheelchair, to non-ambulatory at home over the past several weeks.  Report of Tm 102 yesterday.  No SOB.   ED Course: No fever in ED, WBC 12k, no other SIRS.  Non infected appearing ulcers as below due to sitting in recliner.  UA isnt really impressive, but will get culture.  CXR neg.  X ray of hips just shows severe bilateral osteoarthritis.  EDP offered percocet but patient says this helps very little to not at all.   Review of Systems: As per HPI otherwise 10 point review of systems negative.   Past Medical History:  Diagnosis Date  . Arthritis   . Chronic post-traumatic stress disorder 09/19/2014  . Essential hypertension 02/03/2017  . Hypertension   . Microcytic hypochromic anemia 09/18/2015  . Osteoarthritis of hips, bilateral 09/08/2018  . Type 2 diabetes mellitus, uncontrolled ([place]) 04/06/2015    Past Surgical History:  Procedure Laterality Date  . btl    . KNEE SURGERY    . SKIN GRAFT    . TUBAL LIGATION       reports that she has never smoked. She has never used smokeless tobacco. She reports that she does not drink alcohol or use drugs.  Allergies  Allergen Reactions  . Lisinopril Cough  . Morphine And Related Other (See Comments)    flushing  . Verapamil Other (See Comments)  . Morphine Other (See Comments)   flushing    Family History  Problem Relation Age of Onset  . Diabetes Other      Prior to Admission medications   Medication Sig Start Date End Date Taking? Authorizing Provider  ferrous sulfate 325 (65 FE) MG tablet Take 325 mg by mouth 2 (two) times daily with a meal.  04/18/16  Yes [provider]  fluticasone (FLONASE) 50 MCG/ACT nasal spray Place 2 sprays into both nostrils daily.  04/18/16  Yes [provider]  gabapentin (NEURONTIN) 400 MG capsule TAKE ONE CAPSULE BY MOUTH 4 TIMES A DAY Patient taking differently: Take 400 mg by mouth 4 (four) times daily.  10/02/18  Yes Azzie Glatter, FNP  hydrochlorothiazide (HYDRODIURIL) 25 MG tablet Take 1 tablet (25 mg total) by mouth daily. 04/28/18  Yes Azzie Glatter, FNP  meloxicam (MOBIC) 15 MG tablet Take 1 tablet (15 mg total) by mouth daily. 11/10/18  Yes Azzie Glatter, FNP  metFORMIN (GLUCOPHAGE) 500 MG tablet Take 1 tablet (500 mg total) by mouth 2 (two) times daily with a meal. 11/09/18  Yes Azzie Glatter, FNP  potassium chloride SA (K-DUR,KLOR-CON) 20 MEQ tablet Take 1 tablet (20 mEq total) by mouth daily. 09/13/18  Yes Eugenie Filler, MD  senna-docusate (SENOKOT S) 8.6-50 MG tablet Take 1 tablet by mouth 2 (two) times daily. 09/12/18  Yes Eugenie Filler, MD  triamcinolone cream (KENALOG)  0.1 % Apply 1 application topically 2 (two) times daily. 11/10/18  Yes Azzie Glatter, FNP  acetaminophen (TYLENOL) 500 MG tablet Take 1 tablet (500 mg total) by mouth 3 (three) times daily. Patient not taking: Reported on 12/19/2018 09/12/18   Eugenie Filler, MD  atorvastatin (LIPITOR) 10 MG tablet Take 1 tablet (10 mg total) by mouth daily. Patient not taking: Reported on 12/19/2018 10/25/18 02/22/19  Azzie Glatter, FNP  magnesium oxide (MAG-OX) 400 (241.3 Mg) MG tablet Take 1 tablet (400 mg total) by mouth 2 (two) times daily. Patient not taking: Reported on 12/19/2018 10/02/18   Azzie Glatter, FNP    methocarbamol (ROBAXIN) 500 MG tablet Take 1 tablet (500 mg total) by mouth every 8 (eight) hours as needed for muscle spasms. Patient not taking: Reported on 12/19/2018 10/02/18   Azzie Glatter, FNP  pantoprazole (PROTONIX) 40 MG tablet Take 1 tablet (40 mg total) by mouth daily. Patient not taking: Reported on 12/19/2018 09/12/18 09/12/19  Eugenie Filler, MD    Physical Exam: Vitals:   12/19/18 1817 12/19/18 1819  BP: (!) 148/77   Pulse: 95   Resp: 18   Temp: 98.6 F (37 C)   TempSrc: Oral   SpO2: 98%   Weight:  116.1 kg  Height:  5\' 4"  (1.626 m)    Constitutional: NAD, calm, comfortable Eyes: PERRL, lids and conjunctivae normal ENMT: Mucous membranes are moist. Posterior pharynx clear of any exudate or lesions.Normal dentition.  Neck: normal, supple, no masses, no thyromegaly Respiratory: clear to auscultation bilaterally, no wheezing, no crackles. Normal respiratory effort. No accessory muscle use.  Cardiovascular: Regular rate and rhythm, no murmurs / rubs / gallops. No extremity edema. 2+ pedal pulses. No carotid bruits.  Abdomen: no tenderness, no masses palpated. No hepatosplenomegaly. Bowel sounds positive.  Musculoskeletal: no clubbing / cyanosis. No joint deformity upper and lower extremities. Good ROM, no contractures. Normal muscle tone.  Skin:  Neurologic: CN 2-12 grossly intact. Sensation intact, DTR normal. Strength 5/5 in all 4.  Psychiatric: Normal judgment and insight. Alert and oriented x 3. Normal mood.    Labs on Admission: I have personally reviewed following labs and imaging studies  CBC: Recent Labs  Lab 12/19/18 1900  WBC 12.3*  NEUTROABS 9.3*  HGB 10.3*  HCT 34.5*  MCV 70.8*  PLT 469   Basic Metabolic Panel: Recent Labs  Lab 12/19/18 1900  NA 138  K 4.8  CL 97*  CO2 27  GLUCOSE 119*  BUN 23*  CREATININE 0.77  CALCIUM 9.1   GFR: Estimated Creatinine Clearance: 93.6 mL/min (by C-G formula based on SCr of 0.77 mg/dL). Liver  Function Tests: Recent Labs  Lab 12/19/18 1900  AST 27  ALT 10  ALKPHOS 77  BILITOT 1.3*  PROT 7.3  ALBUMIN 3.5   No results for input(s): LIPASE, AMYLASE in the last 168 hours. No results for input(s): AMMONIA in the last 168 hours. Coagulation Profile: No results for input(s): INR, PROTIME in the last 168 hours. Cardiac Enzymes: No results for input(s): CKTOTAL, CKMB, CKMBINDEX, TROPONINI in the last 168 hours. BNP (last 3 results) No results for input(s): PROBNP in the last 8760 hours. HbA1C: No results for input(s): HGBA1C in the last 72 hours. CBG: Recent Labs  Lab 12/19/18 1958  GLUCAP 86   Lipid Profile: No results for input(s): CHOL, HDL, LDLCALC, TRIG, CHOLHDL, LDLDIRECT in the last 72 hours. Thyroid Function Tests: No results for input(s): TSH, T4TOTAL, FREET4, T3FREE, THYROIDAB  in the last 72 hours. Anemia Panel: No results for input(s): VITAMINB12, FOLATE, FERRITIN, TIBC, IRON, RETICCTPCT in the last 72 hours. Urine analysis:    Component Value Date/Time   COLORURINE YELLOW 12/19/2018 2208   APPEARANCEUR HAZY (A) 12/19/2018 2208   LABSPEC 1.024 12/19/2018 2208   PHURINE 5.0 12/19/2018 2208   GLUCOSEU NEGATIVE 12/19/2018 2208   HGBUR SMALL (A) 12/19/2018 2208   BILIRUBINUR NEGATIVE 12/19/2018 2208   BILIRUBINUR negative 04/15/2018 1520   KETONESUR NEGATIVE 12/19/2018 2208   PROTEINUR NEGATIVE 12/19/2018 2208   UROBILINOGEN 0.2 04/15/2018 1520   NITRITE NEGATIVE 12/19/2018 2208   LEUKOCYTESUR SMALL (A) 12/19/2018 2208    Radiological Exams on Admission: Dg Chest 1 View  Result Date: 12/19/2018 CLINICAL DATA:  Bilateral hip pain EXAM: CHEST  1 VIEW COMPARISON:  None. FINDINGS: Heart and mediastinal contours are within normal limits. No focal opacities or effusions. No acute bony abnormality. IMPRESSION: No active disease. Electronically Signed   By: Rolm Baptise M.D.   On: 12/19/2018 22:35   Dg Hips Bilat W Or Wo Pelvis 3-4 Views  Result Date:  12/19/2018 CLINICAL DATA:  Chronic bilateral hip pain. EXAM: DG HIP (WITH OR WITHOUT PELVIS) 3-4V BILAT COMPARISON:  None. FINDINGS: Severe osteoarthritis within the hips bilaterally with joint space loss, osteophyte formation, subchondral sclerosis and cyst formation. SI joints symmetric and unremarkable. No acute bony abnormality. Specifically, no fracture, subluxation, or dislocation. IMPRESSION: Severe bilateral osteoarthritis.  No acute bony abnormality. Electronically Signed   By: Rolm Baptise M.D.   On: 12/19/2018 22:36    EKG: Independently reviewed.  Assessment/Plan Principal Problem:   Osteoarthritis of hips, bilateral Active Problems:   Essential hypertension   Lymphedema of both lower extremities   Type 2 diabetes mellitus, uncontrolled ([place])    1. Osteoarthritis of hips - now progressed to non-ambulatory status 1. May wish to re-consult ortho in AM to see if their opinion has changed.  Steroid injection of any benefit? 2. PRN toradol 3. PT/OT eval 4. SW consult, likely needs placement 5. Wound care consult for pressure ulcers 2. HTN - continue home BP meds 3. DM2 - continue home metformin 4. Lymphedema of BLE - chronic, stable  DVT prophylaxis: Lovenox Code Status: Full Family Communication: Family at bedside Disposition Plan: TBD Consults called: PT/OT, SW, wound care Admission status: Place in [place], [facility]  How to contact the [facility] Attending or Consulting provider [place] or covering provider during after hours [place], for this patient?  1. Check the care team in [facility] and look for a) attending/consulting [facility] provider listed and b) the [facility] team listed 2. Log into [email or web address]  Amion Physician Scheduling and messaging for groups and whole hospitals  On call and physician scheduling software for group practices, residents, hospitalists and other medical providers for call, clinic, rotation and shift schedules. On[employer] is a  hospital-wide system for scheduling doctors and paging doctors on call. EasyPlot is for scientific plotting and data analysis.  [email or web address]  and use [facility]'s universal password to access. If you do not have the password, please contact the hospital operator.  3. Locate the [facility] provider you are looking for under [facility] and page to a number that you can be directly reached. 4. If you still have difficulty reaching the provider, please page the [facility] (Director on Call) for the Hospitalists listed on amion for assistance.  12/19/2018, 11:22 PM

## 2018-12-19 NOTE — ED Triage Notes (Addendum)
Pt c/o bilateral hip pain ; pt states this is chronic and needs bilateral hip replacement ; pt states that for the past month the pain has gotten a lot worse and has been sitting in a recliner for the last month ; pt has a wound developed x 1-2 weeks ago to the back of the right leg and the back of the right thigh ; pt states she has been taking tylenol and mobic but hasnt been relieving her pain ; Pt received 200 mcg of fentanyl prior to arrival

## 2018-12-19 NOTE — ED Notes (Signed)
Called main lab to add on Hepatic Function

## 2018-12-19 NOTE — ED Notes (Signed)
Istat trop ran in error,  I will have POC credit patient's account.

## 2018-12-20 ENCOUNTER — Encounter (HOSPITAL_COMMUNITY): Payer: Self-pay | Admitting: *Deleted

## 2018-12-20 DIAGNOSIS — E1165 Type 2 diabetes mellitus with hyperglycemia: Secondary | ICD-10-CM | POA: Diagnosis not present

## 2018-12-20 DIAGNOSIS — M160 Bilateral primary osteoarthritis of hip: Secondary | ICD-10-CM | POA: Diagnosis not present

## 2018-12-20 DIAGNOSIS — I10 Essential (primary) hypertension: Secondary | ICD-10-CM | POA: Diagnosis not present

## 2018-12-20 DIAGNOSIS — I890 Lymphedema, not elsewhere classified: Secondary | ICD-10-CM | POA: Diagnosis not present

## 2018-12-20 LAB — GLUCOSE, CAPILLARY
GLUCOSE-CAPILLARY: 141 mg/dL — AB (ref 70–99)
GLUCOSE-CAPILLARY: 106 mg/dL — AB (ref 70–99)
Glucose-Capillary: 130 mg/dL — ABNORMAL HIGH (ref 70–99)
Glucose-Capillary: 119 mg/dL — ABNORMAL HIGH (ref 70–99)
Glucose-Capillary: 150 mg/dL — ABNORMAL HIGH (ref 70–99)

## 2018-12-20 MED ORDER — HYDROCODONE-ACETAMINOPHEN 5-325 MG PO TABS
1.0000 | ORAL_TABLET | Freq: Four times a day (QID) | ORAL | Status: DC | PRN
  Administered 2018-12-20 – 2018-12-24 (×11): 1 via ORAL
  Filled 2018-12-20 (×12): qty 1

## 2018-12-20 MED ORDER — COLLAGENASE 250 UNIT/GM EX OINT
TOPICAL_OINTMENT | Freq: Every day | CUTANEOUS | Status: DC
  Administered 2018-12-20 – 2018-12-25 (×6): via TOPICAL
  Filled 2018-12-20 (×2): qty 30

## 2018-12-20 MED ORDER — MORPHINE SULFATE (PF) 2 MG/ML IV SOLN: 2.0000 mg | Freq: Once | INTRAVENOUS | Status: DC

## 2018-12-20 MED ORDER — HYDROMORPHONE HCL 1 MG/ML IJ SOLN
1.0000 mg | INTRAMUSCULAR | Status: DC | PRN
  Administered 2018-12-20 – 2018-12-25 (×14): 1 mg via INTRAVENOUS
  Filled 2018-12-20 (×15): qty 1

## 2018-12-20 MED ORDER — FENTANYL CITRATE (PF) 100 MCG/2ML IJ SOLN
25.0000 ug | Freq: Once | INTRAMUSCULAR | Status: AC
  Administered 2018-12-20: 25 ug via INTRAVENOUS
  Filled 2018-12-20: qty 2

## 2018-12-20 NOTE — Consult Note (Signed)
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  Will be a Stage 3 or 4 Pressure injury): 3.2cm x 3cm x 1.6cm with necrotic slough in wound bed RLE:  5cm x 2.6cm area of firmly adherent black eschar Wound bed:As described above Drainage (amount, consistency, odor) No drainage from RLE.  Moderate yellow to grey drainage from coccygeal wound Periwound:intact. Maceration in perineal area from urinary incontinence.  Currently using PurWick external female urinary incontinence collection device. Dressing procedure/placement/frequency:To be placed after evening meal tonight onto mattress replacement with low air loss feature to manage pressure and microclimate.  Patient has extreme pain in hips when turning and repositioning, cries out-despite being recently medicated. Chronic lymphedema increases weight of LEs when in bed and is the source of the posterior RLE ulcer.  Sitting in the chair for extended periods of time has resulted in the coccygeal ulcer. Buttock lesions (partial thickness) will respond to mattress replacement, repositioning efforts and house moisture barrier (purple top = Critic Aid Clear) cream.  Consider CCS consult for input on debridement options for coccygeal ulcer which is likely to worsen in the presence of poorly managed diabetes, pain resulting in immobility and incontinence. If you agree, please order/arrange.   [facility] nursing team will not follow, but will remain available to this patient, the nursing and medical teams.  Please re-consult if needed. Thanks, Maudie Flakes, MSN, RN, [place], Arther Abbott  Pager# [phone]

## 2018-12-20 NOTE — Progress Notes (Signed)
Pt arrived to the unit. VS WDL; CBG 130. Pt has multiple ulcers on her back; RN cannot measure pressure  ulcers at this time due to pt's pain level 10/10; pt cannot tolerate being on her side. Will continue to monitor.
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
## 2018-12-20 NOTE — NC FL2 (Signed)
[place] LEVEL OF CARE SCREENING TOOL     IDENTIFICATION  Patient Name: Hayley Alvarado Birthdate: [birth year] Sex: female Admission Date (Current Location): 12/19/2018  [facility] and [place] Number:  Herbalist and Address:  The . [facility], [place] [address], [place], [place] [address]      Provider Number: [id number]  Attending Physician Name and Address:  Cristal Ford, DO  Relative Name and Phone Number:  Wilson Singer, daughter, [phone]    Current Level of Care: Hospital Recommended Level of Care: [place] Prior Approval Number:    Date Approved/Denied:   PASRR Number: [id number]  Discharge Plan: SNF    Current Diagnoses: Patient Active Problem List   Diagnosis Date Noted  . Hypokalemia 09/08/2018  . Osteoarthritis of hips, bilateral 09/08/2018  . Hypertensive urgency 09/08/2018  . Essential hypertension 02/03/2017  . Bilateral hip pain 02/03/2017  . Elevated hemoglobin A1c 02/03/2017  . Hx of iron deficiency anemia 02/03/2017  . Lymphedema 02/03/2017  . Microcytic hypochromic anemia 09/18/2015  . Body mass index 40.0-44.9, adult ([place]) 04/06/2015  . Type 2 diabetes mellitus, uncontrolled ([place]) 04/06/2015  . Artificial knee joint present, bilateral 09/19/2014  . Chronic post-traumatic stress disorder 09/19/2014  . Lymphedema of both lower extremities 09/19/2014    Orientation RESPIRATION BLADDER Height & Weight     Self, Time, Situation, Place  Normal Incontinent Weight: 116.1 kg Height:  5\' 4"  (162.6 cm)  BEHAVIORAL SYMPTOMS/MOOD NEUROLOGICAL BOWEL NUTRITION STATUS      Continent Diet(please see DC summary)  AMBULATORY STATUS COMMUNICATION OF NEEDS Skin   Extensive Assist Verbally PU Stage and Appropriate Care(PU unstageable on sacrum, PU unstageable R leg, PU stage II buttocks)                       Personal Care Assistance Level of Assistance  Bathing, Feeding, Dressing Bathing  Assistance: Maximum assistance Feeding assistance: Limited assistance Dressing Assistance: Maximum assistance     Functional Limitations Info  Sight, Hearing, Speech Sight Info: Impaired Hearing Info: Adequate Speech Info: Adequate    SPECIAL CARE FACTORS FREQUENCY  PT (By licensed PT), OT (By licensed OT)     PT Frequency: 5x/week OT Frequency: 5x/week            Contractures Contractures Info: Not present    Additional Factors Info  Code Status, Allergies Code Status Info: Full Allergies Info: Lisinopril, Morphine And Related, Verapamil, Morphine           Current Medications (12/20/2018):  This is the current hospital active medication list Current Facility-Administered Medications  Medication Dose Route Frequency Provider Last Rate Last Dose  . acetaminophen (TYLENOL) tablet 650 mg  650 mg Oral Q6H PRN Etta Quill, DO   650 mg at 12/20/18 9163   Or  . acetaminophen (TYLENOL) suppository 650 mg  650 mg Rectal Q6H PRN Etta Quill, DO      . enoxaparin (LOVENOX) injection 40 mg  40 mg Subcutaneous Q24H Jennette Kettle M, DO   40 mg at 12/20/18 1121  . ferrous sulfate tablet 325 mg  325 mg Oral BID WC Jennette Kettle M, DO   325 mg at 12/20/18 0955  . fluticasone (FLONASE) 50 MCG/ACT nasal spray 2 spray  2 spray Each Nare Daily Jennette Kettle M, DO   2 spray at 12/20/18 1001  . gabapentin (NEURONTIN) capsule 400 mg  400 mg Oral QID Etta Quill, DO   400  mg at 12/20/18 0955  . hydrochlorothiazide (HYDRODIURIL) tablet 25 mg  25 mg Oral Daily Jennette Kettle M, DO   25 mg at 12/20/18 0955  . HYDROcodone-acetaminophen (NORCO/VICODIN) 5-325 MG per tablet 1 tablet  1 tablet Oral Q6H PRN Cristal Ford, DO   1 tablet at 12/20/18 1116  . ketorolac (TORADOL) 30 MG/ML injection 30 mg  30 mg Intravenous Q6H PRN Etta Quill, DO   30 mg at 12/20/18 0954  . metFORMIN (GLUCOPHAGE) tablet 500 mg  500 mg Oral BID WC Jennette Kettle M, DO   500 mg at 12/20/18 0955  .  ondansetron (ZOFRAN) tablet 4 mg  4 mg Oral Q6H PRN Etta Quill, DO       Or  . ondansetron [facility]) injection 4 mg  4 mg Intravenous Q6H PRN Etta Quill, DO      . potassium chloride SA (K-DUR,KLOR-CON) CR tablet 20 mEq  20 mEq Oral Daily Jennette Kettle M, DO   20 mEq at 12/20/18 0955  . senna-docusate (Senokot-S) tablet 1 tablet  1 tablet Oral BID Etta Quill, DO   1 tablet at 12/20/18 4503     Discharge Medications: Please see discharge summary for a list of discharge medications.  Relevant Imaging Results:  Relevant Lab Results:   Additional Information SSN: [id number]  Estanislado Emms, LCSW

## 2018-12-20 NOTE — Plan of Care (Signed)
  Problem: Coping: Goal: Level of anxiety will decrease Outcome: Progressing   Problem: Pain Managment: Goal: General experience of comfort will improve Outcome: Progressing   Problem: Safety: Goal: Ability to remain free from injury will improve Outcome: Progressing   Problem: Skin Integrity: Goal: Risk for impaired skin integrity will decrease Outcome: Not Progressing

## 2018-12-20 NOTE — Plan of Care (Signed)
  Problem: Education: Goal: Knowledge of General Education information will improve Description Including pain rating scale, medication(s)/side effects and non-pharmacologic comfort measures Outcome: Progressing   Problem: Clinical Measurements: Goal: Will remain free from infection Outcome: Progressing   Problem: Pain Managment: Goal: General experience of comfort will improve Outcome: Progressing   Problem: Activity: Goal: Risk for activity intolerance will decrease Outcome: Not Progressing   Problem: Skin Integrity: Goal: Risk for impaired skin integrity will decrease Outcome: Not Progressing

## 2018-12-20 NOTE — Progress Notes (Signed)
PROGRESS NOTE    Braxtyn Dorff  RKY:[id number] DOB: 1956-03-13 DOA: 12/19/2018 PCP: Azzie Glatter, FNP   Brief Narrative:  HPI on 12/19/2018 by Dr. Jennette Kettle Marliyah Reid is a 63 y.o. female with medical history significant of HTN, DM2, Lymphedema of BLE.  Patient presents to ED with worsening B hip pain for past several months.  She has known severe arthritis of both hips.  Needs hip replacement, but ortho surgery feels that she is not a candidate due to BMI > 40 and lymphedema.  She has progressed from walking with cane, to walker, to wheelchair, to non-ambulatory at home over the past several weeks.  Report of Tm 102 yesterday.  No SOB. Assessment & Plan   Ambulatory dysfunction secondary to Osteoarthritis of hips  -patient states she is not able to walk or move -Continue pain control -PT and OT recommended SNF or home first program -Social work and case management  RLE Pressure ulcer -POA, on Right thigh, with eschar -wound care consulted  Lymphadema of B/L LE -Chronic, stable  Essential hypertension -Continue HCTZ  Diabetes mellitus, type 2 -continue metformin  DVT Prophylaxis  lovenox  Code Status: Full  Family Communication: None at bedside  Disposition Plan: Observation. Suspect home in 24 hrs, pending CM and SW  Consultants None  Procedures  None  Antibiotics   Anti-infectives (From admission, onward)   None      Subjective:   Dashanti Burr seen and examined today.  Complains of not being able to get up and walk. Has been like this for some time. Denies current chest pain, shortness of breath, abdominal pain, N/V/D/C.   Objective:   Vitals:   12/19/18 1819 12/20/18 0008 12/20/18 0100 12/20/18 0635  BP:   119/63 (!) 96/54  Pulse:   94 91  Resp:    14  Temp:  98.7 F (37.1 C) 98.9 F (37.2 C) 99 F (37.2 C)  TempSrc:  Oral Oral Oral  SpO2:   100% 100%  Weight: 116.1 kg     Height: 5\' 4"  (1.626 m)       Intake/Output  Summary (Last 24 hours) at 12/20/2018 1337 Last data filed at 12/20/2018 0800 Gross per 24 hour  Intake 120 ml  Output -  Net 120 ml   Filed Weights   12/19/18 1819  Weight: 116.1 kg    Exam  General: Well developed, well nourished, NAD, appears stated age  HEENT: NCAT, mucous membranes moist.   Neck: Supple  Cardiovascular: S1 S2 auscultated, SEM, RRR  Respiratory: Clear to auscultation bilaterally with equal chest rise  Abdomen: Soft, obese, nontender, nondistended, + bowel sounds  Extremities: warm dry without cyanosis clubbing. Chronic LE lymphadema, with skin changes  Neuro: AAOx3, nonfocal  Psych: Appropriate mood and affect   Data Reviewed: I have personally reviewed following labs and imaging studies  CBC: Recent Labs  Lab 12/19/18 1900  WBC 12.3*  NEUTROABS 9.3*  HGB 10.3*  HCT 34.5*  MCV 70.8*  PLT 315   Basic Metabolic Panel: Recent Labs  Lab 12/19/18 1900  NA 138  K 4.8  CL 97*  CO2 27  GLUCOSE 119*  BUN 23*  CREATININE 0.77  CALCIUM 9.1   GFR: Estimated Creatinine Clearance: 93.6 mL/min (by C-G formula based on SCr of 0.77 mg/dL). Liver Function Tests: Recent Labs  Lab 12/19/18 1900  AST 27  ALT 10  ALKPHOS 77  BILITOT 1.3*  PROT 7.3  ALBUMIN 3.5   No results for  input(s): LIPASE, AMYLASE in the last 168 hours. No results for input(s): AMMONIA in the last 168 hours. Coagulation Profile: No results for input(s): INR, PROTIME in the last 168 hours. Cardiac Enzymes: No results for input(s): CKTOTAL, CKMB, CKMBINDEX, TROPONINI in the last 168 hours. BNP (last 3 results) No results for input(s): PROBNP in the last 8760 hours. HbA1C: No results for input(s): HGBA1C in the last 72 hours. CBG: Recent Labs  Lab 12/19/18 1958 12/20/18 0055 12/20/18 0641 12/20/18 1130  GLUCAP 86 130* 141* 119*   Lipid Profile: No results for input(s): CHOL, HDL, LDLCALC, TRIG, CHOLHDL, LDLDIRECT in the last 72 hours. Thyroid Function  Tests: No results for input(s): TSH, T4TOTAL, FREET4, T3FREE, THYROIDAB in the last 72 hours. Anemia Panel: No results for input(s): VITAMINB12, FOLATE, FERRITIN, TIBC, IRON, RETICCTPCT in the last 72 hours. Urine analysis:    Component Value Date/Time   COLORURINE YELLOW 12/19/2018 2208   APPEARANCEUR HAZY (A) 12/19/2018 2208   LABSPEC 1.024 12/19/2018 2208   PHURINE 5.0 12/19/2018 2208   GLUCOSEU NEGATIVE 12/19/2018 2208   HGBUR SMALL (A) 12/19/2018 2208   BILIRUBINUR NEGATIVE 12/19/2018 2208   BILIRUBINUR negative 04/15/2018 1520   KETONESUR NEGATIVE 12/19/2018 2208   PROTEINUR NEGATIVE 12/19/2018 2208   UROBILINOGEN 0.2 04/15/2018 1520   NITRITE NEGATIVE 12/19/2018 2208   LEUKOCYTESUR SMALL (A) 12/19/2018 2208   Sepsis Labs: @LABRCNTIP (procalcitonin:4,lacticidven:4)  )No results found for this or any previous visit (from the past 240 hour(s)).    Radiology Studies: Dg Chest 1 View  Result Date: 12/19/2018 CLINICAL DATA:  Bilateral hip pain EXAM: CHEST  1 VIEW COMPARISON:  None. FINDINGS: Heart and mediastinal contours are within normal limits. No focal opacities or effusions. No acute bony abnormality. IMPRESSION: No active disease. Electronically Signed   By: Rolm Baptise M.D.   On: 12/19/2018 22:35   Dg Hips Bilat W Or Wo Pelvis 3-4 Views  Result Date: 12/19/2018 CLINICAL DATA:  Chronic bilateral hip pain. EXAM: DG HIP (WITH OR WITHOUT PELVIS) 3-4V BILAT COMPARISON:  None. FINDINGS: Severe osteoarthritis within the hips bilaterally with joint space loss, osteophyte formation, subchondral sclerosis and cyst formation. SI joints symmetric and unremarkable. No acute bony abnormality. Specifically, no fracture, subluxation, or dislocation. IMPRESSION: Severe bilateral osteoarthritis.  No acute bony abnormality. Electronically Signed   By: Rolm Baptise M.D.   On: 12/19/2018 22:36     Scheduled Meds: . enoxaparin (LOVENOX) injection  40 mg Subcutaneous Q24H  . ferrous sulfate   325 mg Oral BID WC  . fluticasone  2 spray Each Nare Daily  . gabapentin  400 mg Oral QID  . hydrochlorothiazide  25 mg Oral Daily  . metFORMIN  500 mg Oral BID WC  . potassium chloride SA  20 mEq Oral Daily  . senna-docusate  1 tablet Oral BID   Continuous Infusions:   LOS: 0 days   Time Spent in minutes   30 minutes  Bayli Quesinberry D.O. on 12/20/2018 at 1:37 PM  Between 7am to 7pm - Please see pager noted on [email or web address]  After 7pm go to [email or web address]  And look for the night coverage person covering for me after hours  [facility] Office  [phone]
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
## 2018-12-20 NOTE — Clinical Social Work Note (Signed)
Clinical Social Work Assessment  Patient Details  Name: Hayley Alvarado MRN: [id number] Date of Birth: [birth year]  Date of referral:  12/20/18               Reason for consult:  Facility Placement, Discharge Planning                Permission sought to share information with:  Facility Sport and exercise psychologist, Family Supports Permission granted to share information::  Yes, Verbal Permission Granted  Name::     Wilson Singer  Agency::  SNFs  Relationship::  daughter  Contact Information:  [phone]  Housing/Transportation Living arrangements for the past 2 months:  Single Family Home Source of Information:  Patient Patient Interpreter Needed:  None Criminal Activity/Legal Involvement Pertinent to Current Situation/Hospitalization:  No - Comment as needed Significant Relationships:  Adult Children Lives with:  Self Do you feel safe going back to the place where you live?  Yes Need for family participation in patient care:  Yes (Comment)  Care giving concerns: Patient from home. PT recommending SNF. Patient is in observation status and not eligible for Medicare coverage of SNF stay. CSW following up with [facility] to determine patient's eligibility for Medicare 3 day waiver.    Social Worker assessment / plan: CSW met with patient and several family members at bedside. Patient alert and oriented. CSW introduced self and role and discussed disposition planning - PT recommendation for SNF.   Patient is agreeable to rehab at [facility]. She states it has been harder and harder to manage at home recently. She was hopeful to go to a rehab center in [place] did explain that patient is not eligible for Medicare coverage of SNF stay due to being in observation status and not having a three night qualifying inpatient stay. CSW did provide list of waiver participating facilities to patient. Patient gave permission to discuss disposition planning with her daughter.  CSW spoke to patient's daughter,  Marthenia Rolling, and explained PT recommendations and 3 day waiver. [name] expressed understanding, and understands that if patient cannot go to SNF under the waiver, she will need to discharge home.  CSW left message for [facility] requesting call back to determine patient's eligibility for waiver. Awaiting call back. Faxed out SNF referrals. Awaiting bed offers.  CSW will follow and support with discharge planning pending patient's approval to participate in the [facility] waiver.   Employment status:  Retired Forensic scientist:  Medicare PT Recommendations:  [place] / Referral to community resources:  [place]  Patient/Family's Response to care: Patient and family appreciative of care.  Patient/Family's Understanding of and Emotional Response to Diagnosis, Current Treatment, and Prognosis: Patient and family with good understanding of patient's condition and are hopeful for SNF placement under [facility] waiver.  Emotional Assessment Appearance:  Appears stated age Attitude/Demeanor/Rapport:  Engaged Affect (typically observed):  Accepting, Calm, Pleasant, Appropriate Orientation:  Oriented to Self, Oriented to Place, Oriented to  Time, Oriented to Situation Alcohol / Substance use:  Not Applicable Psych involvement (Current and /or in the community):  No (Comment)  Discharge Needs  Concerns to be addressed:  Discharge Planning Concerns, Care Coordination Readmission within the last 30 days:  No Current discharge risk:  Physical Impairment Barriers to Discharge:  Continued Medical Work up   Estanislado Emms, LCSW 12/20/2018, 2:37 PM

## 2018-12-21 DIAGNOSIS — I10 Essential (primary) hypertension: Secondary | ICD-10-CM | POA: Diagnosis not present

## 2018-12-21 DIAGNOSIS — E119 Type 2 diabetes mellitus without complications: Secondary | ICD-10-CM | POA: Diagnosis not present

## 2018-12-21 DIAGNOSIS — L8990 Pressure ulcer of unspecified site, unspecified stage: Secondary | ICD-10-CM

## 2018-12-21 DIAGNOSIS — I890 Lymphedema, not elsewhere classified: Secondary | ICD-10-CM | POA: Diagnosis not present

## 2018-12-21 DIAGNOSIS — M160 Bilateral primary osteoarthritis of hip: Secondary | ICD-10-CM | POA: Diagnosis not present

## 2018-12-21 DIAGNOSIS — L89152 Pressure ulcer of sacral region, stage 2: Secondary | ICD-10-CM | POA: Diagnosis not present

## 2018-12-21 LAB — GLUCOSE, CAPILLARY
Glucose-Capillary: 97 mg/dL (ref 70–99)
Glucose-Capillary: 126 mg/dL — ABNORMAL HIGH (ref 70–99)

## 2018-12-21 NOTE — Progress Notes (Signed)
PROGRESS NOTE    Hayley Alvarado  SAY:[id number] DOB: July 22, 1956 DOA: 12/19/2018 PCP: Azzie Glatter, FNP   Brief Narrative: 63 y.o.femalewith medical history significant ofHTN, DM2, Lymphedema of BLE. Patient presents to ED with worsening B hip pain for past several months. She has known severe arthritis of both hips. Needs hip replacement, but ortho surgery feels that she is not a candidate due to BMI >40 and lymphedema.  She has progressed from walking with cane, to walker, to wheelchair, to non-ambulatory at home over the past several weeks.  Report of Tm 102 yesterday.   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  PT hydrotherapy consulted.  Patient will likely will need to go to SNF where they have hydrotherapy available.  Lymphadema of B/L LE -Chronic, stable  Essential hypertension -Continue HCTZ  Diabetes mellitus, type 2 -continue metformin  DVT Prophylaxis  lovenox  Code Status: Full  Family Communication: None at bedside    Pressure Injury 12/20/18 Stage II -  Partial thickness loss of dermis presenting as a shallow open ulcer with a red, pink wound bed without slough. (Active)  12/20/18 0030  Location: Sacrum  Location Orientation: Mid;Lower  Staging: Stage II -  Partial thickness loss of dermis presenting as a  shallow open ulcer with a red, pink wound bed without slough.  Wound Description (Comments):   Present on Admission: Yes     Pressure Injury 12/20/18 Unstageable - Full thickness tissue loss in which the base of the ulcer is covered by slough (yellow, tan, gray, green or brown) and/or eschar (tan, brown or black) in the wound bed. (Active)  12/20/18 0030  Location: Sacrum  Location Orientation:   Staging: Unstageable - Full thickness tissue loss in which the base of the ulcer is covered by slough (yellow, tan, gray, green or brown) and/or eschar (tan, brown or black) in the wound bed.  Wound Description (Comments):   Present on Admission: Yes     Pressure Injury 12/20/18 Stage II -  Partial thickness loss of dermis presenting as a shallow open ulcer with a red, pink wound bed without slough. (Active)  12/20/18 0030  Location: Buttocks  Location Orientation: Right;Left  Staging: Stage II -  Partial thickness loss of dermis presenting as a shallow open ulcer with a red, pink wound bed without slough.  Wound Description (Comments):   Present on Admission: Yes     Pressure Injury 12/20/18 Unstageable - Full thickness tissue loss in which the base of the ulcer is covered by slough (yellow, tan, gray, green or brown) and/or eschar (tan, brown or black) in the wound bed. (Active)  12/20/18 0030  Location: Leg  Location Orientation: Right;Lateral  Staging: Unstageable - Full thickness tissue loss in which the base of the ulcer is covered by slough (yellow, tan, gray, green or brown) and/or eschar (tan, brown or black) in the wound bed.  Wound Description (Comments):   Present on Admission: Yes    Estimated body mass index is 43.94 kg/m as calculated from the following:   Height as of this encounter: 5\' 4"  (1.626 m).   Weight as of this encounter: 116.1 kg.  Subjective: Rec resting in bed denies any specific complaints  Objective: Vitals:   12/20/18 2152 12/21/18 0342 12/21/18 1010  12/21/18 1323  BP: 127/75 139/69 134/63 (!) 116/57  Pulse: 96 (!) 56 79 97  Resp:   17 17  Temp: 97.6 F (36.4 C) 98.3 F (36.8 C) 98.4 F (36.9 C) 98.4 F (36.9 C)  TempSrc: Oral Oral Oral Oral  SpO2: 98% 98% 90% 95%  Weight:      Height:        Intake/Output Summary (Last 24 hours) at 12/21/2018 1455 Last data filed at 12/21/2018 1100 Gross per 24 hour  Intake 1080 ml  Output 620 ml  Net 460 ml   Filed Weights   12/19/18 1819  Weight: 116.1 kg    Examination:  General exam: Appears calm and comfortable  Respiratory system: Clear to auscultation. Respiratory effort normal. Cardiovascular system: S1 & S2 heard, RRR. No JVD, murmurs, rubs, gallops or clicks. No pedal edema. Gastrointestinal system: Abdomen is nondistended, soft and nontender. No organomegaly or masses felt. Normal bowel sounds heard. Central nervous system: Alert and oriented. No focal neurological deficits. Extremities: Symmetric 5 x 5 power. Skin: No rashes, lesions or ulcers Psychiatry: Judgement and insight appear normal. Mood & affect appropriate.     Data Reviewed: I have personally reviewed following labs and imaging studies  CBC: Recent Labs  Lab 12/19/18 1900  WBC 12.3*  NEUTROABS 9.3*  HGB 10.3*  HCT 34.5*  MCV 70.8*  PLT 588   Basic Metabolic Panel: Recent Labs  Lab 12/19/18 1900  NA 138  K 4.8  CL 97*  CO2 27  GLUCOSE 119*  BUN 23*  CREATININE 0.77  CALCIUM 9.1   GFR: Estimated Creatinine Clearance: 93.6 mL/min (by C-G formula based on SCr of 0.77 mg/dL). Liver Function Tests: Recent Labs  Lab 12/19/18 1900  AST 27  ALT 10  ALKPHOS 77  BILITOT 1.3*  PROT 7.3  ALBUMIN 3.5   No results for input(s): LIPASE, AMYLASE in the last 168 hours. No results for input(s): AMMONIA in the last 168 hours. Coagulation Profile: No results for input(s): INR, PROTIME in the last 168 hours. Cardiac Enzymes: No results for input(s): CKTOTAL, CKMB, CKMBINDEX, TROPONINI in the  last 168 hours. BNP (last 3 results) No results for input(s): PROBNP in the last 8760 hours. HbA1C: No results for input(s): HGBA1C in the last 72 hours. CBG: Recent Labs  Lab 12/20/18 1130 12/20/18 1644 12/20/18 1923 12/21/18 0751 12/21/18 1130  GLUCAP 119* 106* 150* 97 126*   Lipid Profile: No results for input(s): CHOL, HDL, LDLCALC, TRIG, CHOLHDL, LDLDIRECT in the last 72 hours. Thyroid Function Tests: No results for input(s): TSH, T4TOTAL, FREET4, T3FREE, THYROIDAB in the last 72 hours. Anemia Panel: No results for input(s): VITAMINB12, FOLATE, FERRITIN, TIBC, IRON, RETICCTPCT in the last 72 hours. Sepsis Labs: No results for input(s): PROCALCITON, LATICACIDVEN in the last 168 hours.  No results found for this or any previous visit (from the past 240 hour(s)).       Radiology Studies: Dg Chest 1 View  Result Date: 12/19/2018 CLINICAL DATA:  Bilateral hip pain EXAM: CHEST  1 VIEW COMPARISON:  None. FINDINGS: Heart and mediastinal contours are within normal limits.  No focal opacities or effusions. No acute bony abnormality. IMPRESSION: No active disease. Electronically Signed   By: Rolm Baptise M.D.   On: 12/19/2018 22:35   Dg Hips Bilat W Or Wo Pelvis 3-4 Views  Result Date: 12/19/2018 CLINICAL DATA:  Chronic bilateral hip pain. EXAM: DG HIP (WITH OR WITHOUT PELVIS) 3-4V BILAT COMPARISON:  None. FINDINGS: Severe osteoarthritis within the hips bilaterally with joint space loss, osteophyte formation, subchondral sclerosis and cyst formation. SI joints symmetric and unremarkable. No acute bony abnormality. Specifically, no fracture, subluxation, or dislocation. IMPRESSION: Severe bilateral osteoarthritis.  No acute bony abnormality. Electronically Signed   By: Rolm Baptise M.D.   On: 12/19/2018 22:36        Scheduled Meds: . collagenase   Topical Daily  . enoxaparin (LOVENOX) injection  40 mg Subcutaneous Q24H  . ferrous sulfate  325 mg Oral BID WC  . fluticasone  2  spray Each Nare Daily  . gabapentin  400 mg Oral QID  . hydrochlorothiazide  25 mg Oral Daily  . metFORMIN  500 mg Oral BID WC  . potassium chloride SA  20 mEq Oral Daily  . senna-docusate  1 tablet Oral BID   Continuous Infusions:   LOS: 0 days     Georgette Shell, MD [facility]  If 7PM-7AM, please contact night-coverage [email or web address] Password [facility] 12/21/2018, 2:55 PM

## 2018-12-21 NOTE — Plan of Care (Signed)
  Problem: Education: Goal: Knowledge of General Education information will improve Description Including pain rating scale, medication(s)/side effects and non-pharmacologic comfort measures Outcome: Progressing   Problem: Clinical Measurements: Goal: Will remain free from infection Outcome: Progressing   Problem: Elimination: Goal: Will not experience complications related to bowel motility Outcome: Progressing Goal: Will not experience complications related to urinary retention Outcome: Progressing   Problem: Pain Managment: Goal: General experience of comfort will improve Outcome: Progressing   Problem: Safety: Goal: Ability to remain free from injury will improve Outcome: Progressing   Problem: Coping: Goal: Level of anxiety will decrease Outcome: Not Progressing

## 2018-12-21 NOTE — Consult Note (Signed)
[facility] Surgery Consult Note  Hayley Alvarado 1956/02/13  [id number].    Requesting MD: Landis Gandy Chief Complaint/Reason for Consult: coccyx wound  HPI:  Hayley Alvarado is a 63yo female PMH DM, HTN, BLE lymphedema, and severe bilateral hip osteoarthritis who was admitted to [facility] 2/8. General surgery asked to see for a sacral wound. Patient states that she has had severe decline in physical function due to bilateral hip arthritis/pain. Previously she was able to ambulate with a cane and then a walker, but over the last 2 months she has been nonambulatory. She has had steroid injections in her hips in the past. States that she was told she needs to lose about 50lb before she is a candidate for total hip replacement. States that her sacral wound has been present for about 2 weeks. It is painful to sit on. Currently doing BID wet to dry dressing changes with santyl.  Anticoagulants: none Nonsmoker Not currently working Prior to admission Hayley Alvarado was living with her daughter but she is moving to [facility] for a new job, looking into rehab facility  ROS: Review of Systems  Constitutional: Negative.   HENT: Negative.   Eyes: Negative.   Respiratory: Negative.   Cardiovascular: Positive for leg swelling.  Gastrointestinal: Negative.   Genitourinary: Negative.   Musculoskeletal: Positive for joint pain.  Skin:       Sacral wound  Neurological: Positive for weakness.   All systems reviewed and otherwise negative except for as above  Family History  Problem Relation Age of Onset  . Diabetes Other     Past Medical History:  Diagnosis Date  . Arthritis   . Chronic post-traumatic stress disorder 09/19/2014  . Essential hypertension 02/03/2017  . Hypertension   . Microcytic hypochromic anemia 09/18/2015  . Osteoarthritis of hips, bilateral 09/08/2018  . Type 2 diabetes mellitus, uncontrolled ([place]) 04/06/2015    Past Surgical History:  Procedure Laterality Date  .  btl    . KNEE SURGERY    . SKIN GRAFT    . TUBAL LIGATION      Social History:  reports that she has never smoked. She has never used smokeless tobacco. She reports that she does not drink alcohol or use drugs.  Allergies:  Allergies  Allergen Reactions  . Lisinopril Cough  . Morphine And Related Other (See Comments)    flushing  . Verapamil Other (See Comments)  . Morphine Other (See Comments)    flushing    Medications Prior to Admission  Medication Sig Dispense Refill  . ferrous sulfate 325 (65 FE) MG tablet Take 325 mg by mouth 2 (two) times daily with a meal.     . fluticasone (FLONASE) 50 MCG/ACT nasal spray Place 2 sprays into both nostrils daily.     Marland Kitchen gabapentin (NEURONTIN) 400 MG capsule TAKE ONE CAPSULE BY MOUTH 4 TIMES A DAY (Patient taking differently: Take 400 mg by mouth 4 (four) times daily. ) 120 capsule 3  . hydrochlorothiazide (HYDRODIURIL) 25 MG tablet Take 1 tablet (25 mg total) by mouth daily. 30 tablet 3  . meloxicam (MOBIC) 15 MG tablet Take 1 tablet (15 mg total) by mouth daily. 30 tablet 3  . metFORMIN (GLUCOPHAGE) 500 MG tablet Take 1 tablet (500 mg total) by mouth 2 (two) times daily with a meal. 120 tablet 3  . potassium chloride SA (K-DUR,KLOR-CON) 20 MEQ tablet Take 1 tablet (20 mEq total) by mouth daily. 30 tablet 0  . senna-docusate (SENOKOT S) 8.6-50 MG tablet  Take 1 tablet by mouth 2 (two) times daily.    Marland Kitchen triamcinolone cream (KENALOG) 0.1 % Apply 1 application topically 2 (two) times daily. 30 g 3    Prior to Admission medications   Medication Sig Start Date End Date Taking? Authorizing Provider  ferrous sulfate 325 (65 FE) MG tablet Take 325 mg by mouth 2 (two) times daily with a meal.  04/18/16  Yes [provider]  fluticasone (FLONASE) 50 MCG/ACT nasal spray Place 2 sprays into both nostrils daily.  04/18/16  Yes [provider]  gabapentin (NEURONTIN) 400 MG capsule TAKE ONE CAPSULE BY MOUTH 4 TIMES A DAY Patient taking  differently: Take 400 mg by mouth 4 (four) times daily.  10/02/18  Yes Azzie Glatter, FNP  hydrochlorothiazide (HYDRODIURIL) 25 MG tablet Take 1 tablet (25 mg total) by mouth daily. 04/28/18  Yes Azzie Glatter, FNP  meloxicam (MOBIC) 15 MG tablet Take 1 tablet (15 mg total) by mouth daily. 11/10/18  Yes Azzie Glatter, FNP  metFORMIN (GLUCOPHAGE) 500 MG tablet Take 1 tablet (500 mg total) by mouth 2 (two) times daily with a meal. 11/09/18  Yes Azzie Glatter, FNP  potassium chloride SA (K-DUR,KLOR-CON) 20 MEQ tablet Take 1 tablet (20 mEq total) by mouth daily. 09/13/18  Yes Eugenie Filler, MD  senna-docusate (SENOKOT S) 8.6-50 MG tablet Take 1 tablet by mouth 2 (two) times daily. 09/12/18  Yes Eugenie Filler, MD  triamcinolone cream (KENALOG) 0.1 % Apply 1 application topically 2 (two) times daily. 11/10/18  Yes Azzie Glatter, FNP    Blood pressure 139/69, pulse (!) 56, temperature 98.3 F (36.8 C), temperature source Oral, resp. rate 14, height 5\' 4"  (1.626 m), weight 116.1 kg, SpO2 98 %. Physical Exam: General: pleasant, WD/WN AA female who is laying in bed in NAD HEENT: head is normocephalic, atraumatic.  Sclera are noninjected.  Pupils equal and round.  Ears and nose without any masses or lesions.  Mouth is pink and moist. Dentition fair Heart: regular, rate, and rhythm.  No obvious murmurs, gallops, or rubs noted.  Palpable pedal pulses bilaterally Lungs: CTAB, no wheezes, rhonchi, or rales noted.  Respiratory effort nonlabored Abd: soft, NT/ND, +BS, no masses, hernias, or organomegaly MS: severe/chronic BLE lymphedema Skin: warm and dry with no masses, lesions, or rashes Psych: A&Ox3 with an appropriate affect. GU: 3x3x1cm open wound over coccyx, necrotic slough at base with yellow drainage, no significant tunneling, no fluctuance     Results for orders placed or performed during the hospital encounter of 12/19/18 (from the past 48 hour(s))  Basic metabolic  panel     Status: Abnormal   Collection Time: 12/19/18  7:00 PM  Result Value Ref Range   Sodium 138 135 - 145 mmol/L   Potassium 4.8 3.5 - 5.1 mmol/L   Chloride 97 (L) 98 - 111 mmol/L   CO2 27 22 - 32 mmol/L   Glucose, Bld 119 (H) 70 - 99 mg/dL   BUN 23 (H) 6 - 20 mg/dL   Creatinine, Ser 0.77 0.44 - 1.00 mg/dL   Calcium 9.1 8.9 - 10.3 mg/dL   GFR calc non Af Amer >60 >60 mL/min   GFR calc Af Amer >60 >60 mL/min   Anion gap 14 5 - 15    Comment: Performed at [facility], [place] [address] [place], [place] [address]  CBC with Differential     Status: Abnormal   Collection Time: 12/19/18  7:00 PM  Result Value Ref Range   WBC 12.3 (H) 4.0 - 10.5 K/uL   RBC 4.87 3.87 - 5.11 MIL/uL   Hemoglobin 10.3 (L) 12.0 - 15.0 g/dL   HCT 34.5 (L) 36.0 - 46.0 %   MCV 70.8 (L) 80.0 - 100.0 fL   MCH 21.1 (L) 26.0 - 34.0 pg   MCHC 29.9 (L) 30.0 - 36.0 g/dL   RDW 15.9 (H) 11.5 - 15.5 %   Platelets 198 150 - 400 K/uL   nRBC 0.0 0.0 - 0.2 %   Neutrophils Relative % 76 %   Neutro Abs 9.3 (H) 1.7 - 7.7 K/uL   Lymphocytes Relative 13 %   Lymphs Abs 1.6 0.7 - 4.0 K/uL   Monocytes Relative 8 %   Monocytes Absolute 0.9 0.1 - 1.0 K/uL   Eosinophils Relative 3 %   Eosinophils Absolute 0.4 0.0 - 0.5 K/uL   Basophils Relative 0 %   Basophils Absolute 0.1 0.0 - 0.1 K/uL   Immature Granulocytes 0 %   Abs Immature Granulocytes 0.03 0.00 - 0.07 K/uL    Comment: Performed at [facility], [address] [place], [place] [address]  Hepatic function panel     Status: Abnormal   Collection Time: 12/19/18  7:00 PM  Result Value Ref Range   Total Protein 7.3 6.5 - 8.1 g/dL   Albumin 3.5 3.5 - 5.0 g/dL   AST 27 15 - 41 U/L   ALT 10 0 - 44 U/L   Alkaline Phosphatase 77 38 - 126 U/L   Total Bilirubin 1.3 (H) 0.3 - 1.2 mg/dL   Bilirubin, Direct 0.5 (H) 0.0 - 0.2 mg/dL   Indirect Bilirubin 0.8 0.3 - 0.9 mg/dL    Comment: Performed at [facility] [address] [place], [place] [address]    I-stat troponin, ED     Status: None   Collection Time: 12/19/18  7:06 PM  Result Value Ref Range   Troponin i, poc 0.00 0.00 - 0.08 ng/mL   Comment 3            Comment: Due to the release kinetics of cTnI, a negative result within the first hours of the onset of symptoms does not rule out myocardial infarction with certainty. If myocardial infarction is still suspected, repeat the test at appropriate intervals.   POC CBG, ED     Status: None   Collection Time: 12/19/18  7:58 PM  Result Value Ref Range   Glucose-Capillary 86 70 - 99 mg/dL  Urinalysis, Routine w reflex microscopic     Status: Abnormal   Collection Time: 12/19/18 10:08 PM  Result Value Ref Range   Color, Urine YELLOW YELLOW   APPearance HAZY (A) CLEAR   Specific Gravity, Urine 1.024 1.005 - 1.030   pH 5.0 5.0 - 8.0   Glucose, UA NEGATIVE NEGATIVE mg/dL   Hgb urine dipstick SMALL (A) NEGATIVE   Bilirubin Urine NEGATIVE NEGATIVE   Ketones, ur NEGATIVE NEGATIVE mg/dL   Protein, ur NEGATIVE NEGATIVE mg/dL   Nitrite NEGATIVE NEGATIVE   Leukocytes, UA SMALL (A) NEGATIVE   RBC / HPF 6-10 0 - 5 RBC/hpf   WBC, UA 6-10 0 - 5 WBC/hpf   Bacteria, UA RARE (A) NONE SEEN   Squamous Epithelial / LPF 0-5 0 - 5   Mucus PRESENT    Hyaline Casts, UA PRESENT     Comment: Performed at [facility], [address] [place], [place] [address]  Glucose, capillary     Status: Abnormal   Collection Time: 12/20/18 12:55 AM  Result Value Ref Range   Glucose-Capillary 130 (H) 70 - 99 mg/dL   Comment 1 Document in Chart   Glucose, capillary     Status: Abnormal   Collection Time: 12/20/18  6:41 AM  Result Value Ref Range   Glucose-Capillary 141 (H) 70 - 99 mg/dL  Glucose, capillary     Status: Abnormal   Collection Time: 12/20/18 11:30 AM  Result Value Ref Range   Glucose-Capillary 119 (H) 70 - 99 mg/dL  Glucose, capillary     Status: Abnormal   Collection Time: 12/20/18  4:44 PM  Result Value Ref Range    Glucose-Capillary 106 (H) 70 - 99 mg/dL  Glucose, capillary     Status: Abnormal   Collection Time: 12/20/18  7:23 PM  Result Value Ref Range   Glucose-Capillary 150 (H) 70 - 99 mg/dL  Glucose, capillary     Status: None   Collection Time: 12/21/18  7:51 AM  Result Value Ref Range   Glucose-Capillary 97 70 - 99 mg/dL  Glucose, capillary     Status: Abnormal   Collection Time: 12/21/18 11:30 AM  Result Value Ref Range   Glucose-Capillary 126 (H) 70 - 99 mg/dL   Dg Chest 1 View  Result Date: 12/19/2018 CLINICAL DATA:  Bilateral hip pain EXAM: CHEST  1 VIEW COMPARISON:  None. FINDINGS: Heart and mediastinal contours are within normal limits. No focal opacities or effusions. No acute bony abnormality. IMPRESSION: No active disease. Electronically Signed   By: Rolm Baptise M.D.   On: 12/19/2018 22:35   Dg Hips Bilat W Or Wo Pelvis 3-4 Views  Result Date: 12/19/2018 CLINICAL DATA:  Chronic bilateral hip pain. EXAM: DG HIP (WITH OR WITHOUT PELVIS) 3-4V BILAT COMPARISON:  None. FINDINGS: Severe osteoarthritis within the hips bilaterally with joint space loss, osteophyte formation, subchondral sclerosis and cyst formation. SI joints symmetric and unremarkable. No acute bony abnormality. Specifically, no fracture, subluxation, or dislocation. IMPRESSION: Severe bilateral osteoarthritis.  No acute bony abnormality. Electronically Signed   By: Rolm Baptise M.D.   On: 12/19/2018 22:36    Anti-infectives (From admission, onward)   None       Assessment/Plan DM HTN BLE lymphedema Severe bilateral hip osteoarthritis, nonambulatory  Coccyx wound - Agree with twice daily wet to dry dressing changes with Santyl. Patient is on an air mattress and should be turned frequently to keep pressure off the area. Will consult PT for hydrotherapy.  ID - none VTE - lovenox FEN - CM diet Foley - wick Follow up - TBD   Wellington Hampshire, [facility] 12/21/2018, 12:28 PM Pager:  [phone] Mon-Thurs 7:00 am-4:30 pm Fri 7:00 am -11:30 AM Sat-Sun 7:00 am-11:30 am

## 2018-12-22 DIAGNOSIS — R52 Pain, unspecified: Secondary | ICD-10-CM | POA: Diagnosis not present

## 2018-12-22 DIAGNOSIS — F4312 Post-traumatic stress disorder, chronic: Secondary | ICD-10-CM | POA: Diagnosis present

## 2018-12-22 DIAGNOSIS — E119 Type 2 diabetes mellitus without complications: Secondary | ICD-10-CM | POA: Diagnosis not present

## 2018-12-22 DIAGNOSIS — M2550 Pain in unspecified joint: Secondary | ICD-10-CM | POA: Diagnosis not present

## 2018-12-22 DIAGNOSIS — Z7984 Long term (current) use of oral hypoglycemic drugs: Secondary | ICD-10-CM | POA: Diagnosis not present

## 2018-12-22 DIAGNOSIS — L8995 Pressure ulcer of unspecified site, unstageable: Secondary | ICD-10-CM

## 2018-12-22 DIAGNOSIS — M6281 Muscle weakness (generalized): Secondary | ICD-10-CM | POA: Diagnosis not present

## 2018-12-22 DIAGNOSIS — Z888 Allergy status to other drugs, medicaments and biological substances status: Secondary | ICD-10-CM | POA: Diagnosis not present

## 2018-12-22 DIAGNOSIS — D559 Anemia due to enzyme disorder, unspecified: Secondary | ICD-10-CM | POA: Diagnosis not present

## 2018-12-22 DIAGNOSIS — Z7401 Bed confinement status: Secondary | ICD-10-CM | POA: Diagnosis not present

## 2018-12-22 DIAGNOSIS — L89152 Pressure ulcer of sacral region, stage 2: Secondary | ICD-10-CM | POA: Diagnosis present

## 2018-12-22 DIAGNOSIS — D72829 Elevated white blood cell count, unspecified: Secondary | ICD-10-CM | POA: Diagnosis not present

## 2018-12-22 DIAGNOSIS — Z7409 Other reduced mobility: Secondary | ICD-10-CM | POA: Diagnosis not present

## 2018-12-22 DIAGNOSIS — Z6841 Body Mass Index (BMI) 40.0 and over, adult: Secondary | ICD-10-CM | POA: Diagnosis not present

## 2018-12-22 DIAGNOSIS — E876 Hypokalemia: Secondary | ICD-10-CM | POA: Diagnosis not present

## 2018-12-22 DIAGNOSIS — E1165 Type 2 diabetes mellitus with hyperglycemia: Secondary | ICD-10-CM | POA: Diagnosis present

## 2018-12-22 DIAGNOSIS — L89322 Pressure ulcer of left buttock, stage 2: Secondary | ICD-10-CM | POA: Diagnosis present

## 2018-12-22 DIAGNOSIS — L89890 Pressure ulcer of other site, unstageable: Secondary | ICD-10-CM | POA: Diagnosis present

## 2018-12-22 DIAGNOSIS — L89150 Pressure ulcer of sacral region, unstageable: Secondary | ICD-10-CM | POA: Diagnosis present

## 2018-12-22 DIAGNOSIS — Z96653 Presence of artificial knee joint, bilateral: Secondary | ICD-10-CM | POA: Diagnosis present

## 2018-12-22 DIAGNOSIS — R0902 Hypoxemia: Secondary | ICD-10-CM | POA: Diagnosis not present

## 2018-12-22 DIAGNOSIS — I890 Lymphedema, not elsewhere classified: Secondary | ICD-10-CM | POA: Diagnosis present

## 2018-12-22 DIAGNOSIS — M25559 Pain in unspecified hip: Secondary | ICD-10-CM | POA: Diagnosis not present

## 2018-12-22 DIAGNOSIS — M160 Bilateral primary osteoarthritis of hip: Secondary | ICD-10-CM | POA: Diagnosis present

## 2018-12-22 DIAGNOSIS — I10 Essential (primary) hypertension: Secondary | ICD-10-CM | POA: Diagnosis present

## 2018-12-22 DIAGNOSIS — Z993 Dependence on wheelchair: Secondary | ICD-10-CM | POA: Diagnosis not present

## 2018-12-22 DIAGNOSIS — M25552 Pain in left hip: Secondary | ICD-10-CM | POA: Diagnosis not present

## 2018-12-22 DIAGNOSIS — R269 Unspecified abnormalities of gait and mobility: Secondary | ICD-10-CM | POA: Diagnosis not present

## 2018-12-22 DIAGNOSIS — Z791 Long term (current) use of non-steroidal anti-inflammatories (NSAID): Secondary | ICD-10-CM | POA: Diagnosis not present

## 2018-12-22 DIAGNOSIS — Z20828 Contact with and (suspected) exposure to other viral communicable diseases: Secondary | ICD-10-CM | POA: Diagnosis present

## 2018-12-22 DIAGNOSIS — Z885 Allergy status to narcotic agent status: Secondary | ICD-10-CM | POA: Diagnosis not present

## 2018-12-22 DIAGNOSIS — Z79899 Other long term (current) drug therapy: Secondary | ICD-10-CM | POA: Diagnosis not present

## 2018-12-22 DIAGNOSIS — Z833 Family history of diabetes mellitus: Secondary | ICD-10-CM | POA: Diagnosis not present

## 2018-12-22 DIAGNOSIS — D509 Iron deficiency anemia, unspecified: Secondary | ICD-10-CM | POA: Diagnosis present

## 2018-12-22 DIAGNOSIS — Z862 Personal history of diseases of the blood and blood-forming organs and certain disorders involving the immune mechanism: Secondary | ICD-10-CM | POA: Diagnosis not present

## 2018-12-22 DIAGNOSIS — F4310 Post-traumatic stress disorder, unspecified: Secondary | ICD-10-CM | POA: Diagnosis not present

## 2018-12-22 DIAGNOSIS — Z7951 Long term (current) use of inhaled steroids: Secondary | ICD-10-CM | POA: Diagnosis not present

## 2018-12-22 DIAGNOSIS — L89312 Pressure ulcer of right buttock, stage 2: Secondary | ICD-10-CM | POA: Diagnosis present

## 2018-12-22 DIAGNOSIS — M25551 Pain in right hip: Secondary | ICD-10-CM | POA: Diagnosis not present

## 2018-12-22 LAB — GLUCOSE, CAPILLARY: GLUCOSE-CAPILLARY: 165 mg/dL — AB (ref 70–99)

## 2018-12-22 MED ORDER — METHOCARBAMOL 1000 MG/10ML IJ SOLN
500.0000 mg | Freq: Three times a day (TID) | INTRAVENOUS | Status: DC | PRN
  Filled 2018-12-22: qty 5

## 2018-12-22 MED ORDER — INSULIN ASPART 100 UNIT/ML ~~LOC~~ SOLN
0.0000 [IU] | Freq: Three times a day (TID) | SUBCUTANEOUS | Status: DC
  Administered 2018-12-22: 2 [IU] via SUBCUTANEOUS
  Administered 2018-12-23 – 2018-12-25 (×5): 1 [IU] via SUBCUTANEOUS

## 2018-12-22 NOTE — Consult Note (Signed)
   [facility] Inpatient Consult   12/22/2018  Hayley Alvarado 11-Sep-1956 [id number]  Patient screened for hospitalizations to check if potential [facility] services are needed . Patient was hospitalized for bilateral osteoarthritis of hips, with a HX including but not limited to Diabetes Mellitus Type 2, currently being recommended for a skilled nursing facility stay. Primary Care Provider is  Kathe Becton, FNP at [facility].   Will follow for disposition and needs. If patient goes to a facility in [facility] area (patient wanted the [facility] area) will determine if follow up needs are appropriate for community disease management program. Please place a [facility] Care Management consult or for questions contact:   Natividad Brood, RN BSN [facility] Liaison  [phone] business mobile phone Toll free office [phone]

## 2018-12-22 NOTE — Plan of Care (Signed)
  Problem: Nutrition: Goal: Adequate nutrition will be maintained Outcome: Progressing   Problem: Coping: Goal: Level of anxiety will decrease Outcome: Progressing   Problem: Pain Managment: Goal: General experience of comfort will improve Outcome: Progressing   

## 2018-12-22 NOTE — Progress Notes (Addendum)
PROGRESS NOTE    Hayley Alvarado  FMB:[id number] DOB: 07/21/1956 DOA: 12/19/2018 PCP: Azzie Glatter, FNP   Brief Narrative:  HPI on 12/19/2018 by Dr. Jennette Kettle Hayley Alvarado is a 63 y.o. female with medical history significant of HTN, DM2, Lymphedema of BLE.  Patient presents to ED with worsening B hip pain for past several months.  She has known severe arthritis of both hips.  Needs hip replacement, but ortho surgery feels that she is not a candidate due to BMI > 40 and lymphedema.  She has progressed from walking with cane, to walker, to wheelchair, to non-ambulatory at home over the past several weeks.  Report of Tm 102 yesterday.  No SOB.  Interim hisory Admitted for ambulatory dysfunction. Found to have sacral ulcer. Surgery consulted and starting hydrotherapy. Assessment & Plan   Ambulatory dysfunction secondary to Osteoarthritis of hips  -patient states she is not able to walk or move -Continue pain control -PT and OT recommended SNF  -Social work and case management- pending placement  RLE Pressure ulcer/sacral decub -POA, on Right thigh, with eschar -POA, sacral decub, Unstageable, but full thickness.  Will be a Stage 3 or 4 Pressure injury -wound care consulted -general surgery consulted and appreciated, recommended hydrotherapy -continue pain control  -will add on muscle relaxant   Lymphadema of B/L LE -Chronic, stable  Essential hypertension -Continue HCTZ  Diabetes mellitus, type 2 -discontinue metformin and place on ISS  DVT Prophylaxis  lovenox  Code Status: Full  Family Communication: None at bedside  Disposition Plan: Admitted. Continue hydrotherapy. Pending SNF>  Consultants General surgery   Procedures  None  Antibiotics   Anti-infectives (From admission, onward)   None      Subjective:   Hayley Alvarado seen and examined today.  Complains of not being able to move without help.  Upset that things cough thus far.  Very  tearful.  Complains of pain with movement.  Denies current chest pain, shortness breath, abdominal pain, nausea vomiting, diarrhea constipation, dizziness or headache. Objective:   Vitals:   12/21/18 1323 12/21/18 1934 12/22/18 0540 12/22/18 1235  BP: (!) 116/57 116/64 111/62 (!) 147/64  Pulse: 97 86 88 91  Resp: 17 16 17    Temp: 98.4 F (36.9 C) 97.7 F (36.5 C) 98.3 F (36.8 C) 98.4 F (36.9 C)  TempSrc: Oral Oral Oral   SpO2: 95% 100% 95% 99%  Weight:      Height:        Intake/Output Summary (Last 24 hours) at 12/22/2018 1417 Last data filed at 12/22/2018 0900 Gross per 24 hour  Intake 720 ml  Output 1500 ml  Net -780 ml   Filed Weights   12/19/18 1819  Weight: 116.1 kg   Exam  General: Well developed, well nourished, NAD, appears stated age  57: NCAT,mucous membranes moist.   Neck: Supple  Cardiovascular: S1 S2 auscultated, SEM, RRR  Respiratory: Clear to auscultation bilaterally with equal chest rise  Abdomen: Soft, obese, nontender, nondistended, + bowel sounds  Extremities: warm dry without cyanosis clubbing. Chronic LE lymphadema with skin changes  Neuro: AAOx3, nonfocal  Psych: Appropriate mood and affect, tearful  Data Reviewed: I have personally reviewed following labs and imaging studies  CBC: Recent Labs  Lab 12/19/18 1900  WBC 12.3*  NEUTROABS 9.3*  HGB 10.3*  HCT 34.5*  MCV 70.8*  PLT 701   Basic Metabolic Panel: Recent Labs  Lab 12/19/18 1900  NA 138  K 4.8  CL 97*  CO2 27  GLUCOSE 119*  BUN 23*  CREATININE 0.77  CALCIUM 9.1   GFR: Estimated Creatinine Clearance: 93.6 mL/min (by C-G formula based on SCr of 0.77 mg/dL). Liver Function Tests: Recent Labs  Lab 12/19/18 1900  AST 27  ALT 10  ALKPHOS 77  BILITOT 1.3*  PROT 7.3  ALBUMIN 3.5   No results for input(s): LIPASE, AMYLASE in the last 168 hours. No results for input(s): AMMONIA in the last 168 hours. Coagulation Profile: No results for input(s): INR,  PROTIME in the last 168 hours. Cardiac Enzymes: No results for input(s): CKTOTAL, CKMB, CKMBINDEX, TROPONINI in the last 168 hours. BNP (last 3 results) No results for input(s): PROBNP in the last 8760 hours. HbA1C: No results for input(s): HGBA1C in the last 72 hours. CBG: Recent Labs  Lab 12/20/18 1130 12/20/18 1644 12/20/18 1923 12/21/18 0751 12/21/18 1130  GLUCAP 119* 106* 150* 97 126*   Lipid Profile: No results for input(s): CHOL, HDL, LDLCALC, TRIG, CHOLHDL, LDLDIRECT in the last 72 hours. Thyroid Function Tests: No results for input(s): TSH, T4TOTAL, FREET4, T3FREE, THYROIDAB in the last 72 hours. Anemia Panel: No results for input(s): VITAMINB12, FOLATE, FERRITIN, TIBC, IRON, RETICCTPCT in the last 72 hours. Urine analysis:    Component Value Date/Time   COLORURINE YELLOW 12/19/2018 2208   APPEARANCEUR HAZY (A) 12/19/2018 2208   LABSPEC 1.024 12/19/2018 2208   PHURINE 5.0 12/19/2018 2208   GLUCOSEU NEGATIVE 12/19/2018 2208   HGBUR SMALL (A) 12/19/2018 2208   BILIRUBINUR NEGATIVE 12/19/2018 2208   BILIRUBINUR negative 04/15/2018 1520   KETONESUR NEGATIVE 12/19/2018 2208   PROTEINUR NEGATIVE 12/19/2018 2208   UROBILINOGEN 0.2 04/15/2018 1520   NITRITE NEGATIVE 12/19/2018 2208   LEUKOCYTESUR SMALL (A) 12/19/2018 2208   Sepsis Labs: @LABRCNTIP (procalcitonin:4,lacticidven:4)  )No results found for this or any previous visit (from the past 240 hour(s)).    Radiology Studies: No results found.   Scheduled Meds: . collagenase   Topical Daily  . enoxaparin (LOVENOX) injection  40 mg Subcutaneous Q24H  . ferrous sulfate  325 mg Oral BID WC  . fluticasone  2 spray Each Nare Daily  . gabapentin  400 mg Oral QID  . hydrochlorothiazide  25 mg Oral Daily  . metFORMIN  500 mg Oral BID WC  . potassium chloride SA  20 mEq Oral Daily  . senna-docusate  1 tablet Oral BID   Continuous Infusions:   LOS: 0 days   Time Spent in minutes   30 minutes  Hayley Alvarado  D.O. on 12/22/2018 at 2:17 PM  Between 7am to 7pm - Please see pager noted on [email or web address]  After 7pm go to [email or web address]  And look for the night coverage person covering for me after hours  [facility] Office  [phone]

## 2018-12-22 NOTE — Progress Notes (Signed)
Physical Therapy Wound Treatment Patient Details  Name: Janeane Cozart MRN: [id number] Date of Birth: [birth year]  Today's Date: 12/22/2018 Time: 1000-1040 Time Calculation (min): 40 min  Subjective  Subjective: Patient screaming in pain during rolling Patient and Family Stated Goals: To heal wound Date of Onset: 12/19/18(present on admission) Prior Treatments: none  Pain Score: Pain Score: 9 (when rolling)  Wound Assessment  Pressure Injury 12/20/18 Unstageable - Full thickness tissue loss in which the base of the ulcer is covered by slough (yellow, tan, gray, green or brown) and/or eschar (tan, brown or black) in the wound bed. (Active)  Wound Image   12/22/2018 11:22 AM  Dressing Type Foam;Gauze (Comment) 12/22/2018 11:22 AM  Dressing Clean;Dry;Intact 12/22/2018 11:22 AM  Dressing Change Frequency Twice a day 12/22/2018 11:22 AM  State of Healing Eschar 12/22/2018 11:22 AM  Site / Wound Assessment Brown;Red;Yellow 12/22/2018 11:22 AM  % Wound base Red or Granulating 10% 12/22/2018 11:22 AM  % Wound base Yellow/Fibrinous Exudate 50% 12/22/2018 11:22 AM  % Wound base Black/Eschar 40% 12/22/2018 11:22 AM  % Wound base Other/Granulation Tissue (Comment) 0% 12/22/2018 11:22 AM  Peri-wound Assessment Maceration 12/22/2018 11:22 AM  Wound Length (cm) 3.2 cm 12/22/2018 11:22 AM  Wound Width (cm) 3 cm 12/22/2018 11:22 AM  Wound Depth (cm) 2 cm 12/22/2018 11:22 AM  Wound Surface Area (cm^2) 9.6 cm^2 12/22/2018 11:22 AM  Wound Volume (cm^3) 19.2 cm^3 12/22/2018 11:22 AM  Tunneling (cm) 0 12/22/2018 11:22 AM  Undermining (cm) 0 12/22/2018 11:22 AM  Margins Unattached edges (unapproximated) 12/22/2018 11:22 AM  Drainage Amount Minimal 12/22/2018 11:22 AM  Drainage Description Serosanguineous 12/22/2018 11:22 AM  Treatment Debridement (Selective);Hydrotherapy (Pulse lavage) 12/22/2018 11:22 AM     Santyl applied to wound bed prior to applying dressing.  Hydrotherapy Pulsed lavage therapy - wound location:  sacrum Pulsed Lavage with Suction (psi): 12 psi Pulsed Lavage with Suction - Normal Saline Used: 1000 mL Pulsed Lavage Tip: Tip with splash shield Selective Debridement Selective Debridement - Location: sacrum Selective Debridement - Tools Used: Forceps;Scissors Selective Debridement - Tissue Removed: slough   Wound Assessment and Plan  Wound Therapy - Assess/Plan/Recommendations Wound Therapy - Clinical Statement: Pt presents with small with mostly eschar/slough.  Healing will be complicated by immobility and DM.  Pt will benefit from continued hydrotherapy to cleanse wound and promote healing. Wound Therapy - Functional Problem List: open wound; immobility Factors Delaying/Impairing Wound Healing: Diabetes Mellitus;Immobility Hydrotherapy Plan: Debridement;Pulsatile lavage with suction;Patient/family education;Dressing change Wound Therapy - Frequency: 6X / week Wound Therapy - Follow Up Recommendations: Skilled nursing facility Wound Plan: see above  Wound Therapy Goals- Improve the function of patient's integumentary system by progressing the wound(s) through the phases of wound healing (inflammation - proliferation - remodeling) by: Decrease Necrotic Tissue to: 30 Decrease Necrotic Tissue - Progress: Goal set today Increase Granulation Tissue to: 70 Increase Granulation Tissue - Progress: Goal set today Decrease Length/Width/Depth by (cm): Depth by 0.5 cm Decrease Length/Width/Depth - Progress: Goal set today Goals/treatment plan/discharge plan were made with and agreed upon by patient/family: Yes Time For Goal Achievement: 2 weeks Wound Therapy - Potential for Goals: Good  Goals will be updated until maximal potential achieved or discharge criteria met.  Discharge criteria: when goals achieved, discharge from hospital, MD decision/surgical intervention, no progress towards goals, refusal/missing three consecutive treatments without notification or medical reason.  GP      Shanna Cisco 12/22/2018, 11:47 AM  12/22/2018   Kend[place], PT Acute Rehabilitation Services Pager:  [phone] Office:  [phone]

## 2018-12-22 NOTE — Progress Notes (Signed)
Physical Therapy Treatment Patient Details Name: Hayley Alvarado MRN: [id number] DOB: 02-13-1956 Today's Date: 12/22/2018    History of Present Illness Hayley Alvarado is a 62 y.o. female with medical history significant of HTN, DM2, Lymphedema of BLE.  Patient presents to ED with worsening B hip pain for past several months.  She has known severe arthritis of both hips.  Needs hip replacement, but ortho surgery feels that she is not a candidate due to BMI > 40 and lymphedema. She has declined from walking with cane, to walker, to wheelchair, to non-ambulatory at home over the past several weeks.    PT Comments    Continuing work on functional mobility and activity tolerance;     Attempted mobilizing OOB with use of Maximove, however, pt unable to tolerate due to complaints of B hip pain with elevation of lift, especially at initial lift off of the bed. Had to lower back soen due to pain.  Attempted using relaxation techniques during session.     Encouraged pt to change positions independently while in bed using bed controls.  Follow Up Recommendations  SNF;Supervision/Assistance - 24 hour     Equipment Recommendations  Other (comment)(to be determined)    Recommendations for Other Services       Precautions / Restrictions Precautions Precautions: Fall Precaution Comments: sacral wound; Leg wound    Mobility  Bed Mobility Overal bed mobility: Needs Assistance Bed Mobility: Rolling Rolling: +2 for physical assistance;Max assist         General bed mobility comments: Heavy use of bedrail to roll; Pt put bed in trendelenurg position, and pulled on headboard to slide up in the bed with +2 mod assist   Transfers                 General transfer comment: attempted use of Maximove; unable to tolerate  Ambulation/Gait                 Stairs             Wheelchair Mobility    Modified Rankin (Stroke Patients Only)       Balance Overall balance  assessment: Needs assistance                                          Cognition Arousal/Alertness: Awake/alert Behavior During Therapy: Anxious Overall Cognitive Status: Within Functional Limits for tasks assessed                                        Exercises      General Comments        Pertinent Vitals/Pain Pain Assessment: Faces Faces Pain Scale: Hurts worst Pain Location: B hips; L more than R Pain Descriptors / Indicators: Aching;Grimacing;Guarding;Crying;Constant Pain Intervention(s): Limited activity within patient's tolerance;RN gave pain meds during session;Repositioned;Relaxation    Home Living                      Prior Function            PT Goals (current goals can now be found in the care plan section) Acute Rehab PT Goals Patient Stated Goal: would like to go to rehab to prepare for hip replacement surgeries PT Goal Formulation: With patient Time For Goal Achievement: 01/03/19 Potential  to Achieve Goals: Fair Progress towards PT goals: Progressing toward goals(very slowly)    Frequency    Min 3X/week      PT Plan Current plan remains appropriate    Co-evaluation PT/OT/SLP Co-Evaluation/Treatment: Yes Reason for Co-Treatment: Complexity of the patient's impairments (multi-system involvement);For patient/therapist safety;To address functional/ADL transfers PT goals addressed during session: Mobility/safety with mobility OT goals addressed during session: ADL's and self-care      AM-PAC PT "6 Clicks" Mobility   Outcome Measure  Help needed turning from your back to your side while in a flat bed without using bedrails?: Total Help needed moving from lying on your back to sitting on the side of a flat bed without using bedrails?: A Lot Help needed moving to and from a bed to a chair (including a wheelchair)?: Total Help needed standing up from a chair using your arms (e.g., wheelchair or bedside  chair)?: Total Help needed to walk in hospital room?: Total Help needed climbing 3-5 steps with a railing? : Total 6 Click Score: 7    End of Session Equipment Utilized During Treatment: Other (comment)(Bed pad, Maximove) Activity Tolerance: Patient limited by pain Patient left: in bed;with call bell/phone within reach;Other (comment)(bed control within reach) Nurse Communication: Mobility status;Patient requests pain meds PT Visit Diagnosis: Other abnormalities of gait and mobility (R26.89);Muscle weakness (generalized) (M62.81);Adult, failure to thrive (R62.7);Pain Pain - Right/Left: (bilatera) Pain - part of body: Hip     Time: 7014-1030 PT Time Calculation (min) (ACUTE ONLY): 36 min  Charges:  $Therapeutic Activity: 8-22 mins                     Roney Marion, PT  Acute Rehabilitation Services Pager [phone] Office [place] 12/22/2018, 12:53 PM

## 2018-12-22 NOTE — Progress Notes (Signed)
Occupational Therapy Treatment Patient Details Name: Hayley Alvarado MRN: [id number] DOB: 02-Feb-1956 Today's Date: 12/22/2018    History of present illness Hayley Alvarado is a 63 y.o. female with medical history significant of HTN, DM2, Lymphedema of BLE.  Patient presents to ED with worsening B hip pain for past several months.  She has known severe arthritis of both hips.  Needs hip replacement, but ortho surgery feels that she is not a candidate due to BMI > 40 and lymphedema. She has declined from walking with cane, to walker, to wheelchair, to non-ambulatory at home over the past several weeks.   OT comments  Attempted mobilizing OOB with use of Maximove, however, pt unable to tolerate due to complaints of B hip pain with elevation of lift. Attempted using relaxation techniques during session. Discussed with nursing possibility of use of anti-anxiety medication to assist with progressing with mobility. Nsg to discuss with MD. Continue to recommend rehab at [facility]. Encouraged pt to change positions independently while in bed using bed controls. Will continue to follow acutely and reassess goals as appropriate.   Follow Up Recommendations  SNF    Equipment Recommendations  Other (comment)(TBA)    Recommendations for Other Services      Precautions / Restrictions Precautions Precautions: Fall Precaution Comments: sacral wound; Leg wound       Mobility Bed Mobility Overal bed mobility: Needs Assistance Bed Mobility: Rolling Rolling: +2 for physical assistance;Max assist         General bed mobility comments: Heavy useof bedrail to roll  Transfers                 General transfer comment: attempted use of Maximove; unable to tolerate    Balance Overall balance assessment: Needs assistance                                         ADL either performed or assessed with clinical judgement   ADL Overall ADL's : Needs assistance/impaired Eating/Feeding:  Set up;Bed level   Grooming: Set up;Bed level   Upper Body Bathing: Minimal assistance;Bed level   Lower Body Bathing: Maximal assistance;Bed level                       Functional mobility during ADLs: +2 for physical assistance;Maximal assistance(bed level; attempted use of Maximove) General ADL Comments: May benefit form use of AE     Vision       Perception     Praxis      Cognition Arousal/Alertness: Awake/alert Behavior During Therapy: Anxious Overall Cognitive Status: Within Functional Limits for tasks assessed                                          Exercises     Shoulder Instructions       General Comments  Pt given increased time to roll with her pulling on rails to reduce anxiety; attempted using deep breathing and visualization; Maximove pad appeared uncomfortable under L hip - will attempt use of pillow under lift pad to increase comfort.     Pertinent Vitals/ Pain       Pain Assessment: Faces Faces Pain Scale: Hurts worst Pain Location: B hips; L more than R Pain Descriptors / Indicators: Aching;Grimacing;Guarding;Crying;Constant Pain Intervention(s): Limited  activity within patient's tolerance;Repositioned  Home Living                                          Prior Functioning/Environment              Frequency  Min 2X/week        Progress Toward Goals  OT Goals(current goals can now be found in the care plan section)  Progress towards OT goals: Progressing toward goals  Acute Rehab OT Goals Patient Stated Goal: would like to go to rehab to prepare for hip replacement surgeries OT Goal Formulation: With patient Time For Goal Achievement: 01/03/19 Potential to Achieve Goals: Fair ADL Goals Pt/caregiver will Perform Home Exercise Program: Increased strength;Both right and left upper extremity;With written HEP provided Additional ADL Goal #1: Pt will sit EOB for 5 minutes at min guard level  to prepare for EOB ADLs. Additional ADL Goal #2: Pt and caregiver will be independent with education on reducing risk of skin breakdown.  Plan Discharge plan remains appropriate    Co-evaluation    PT/OT/SLP Co-Evaluation/Treatment: Yes Reason for Co-Treatment: Complexity of the patient's impairments (multi-system involvement);For patient/therapist safety;To address functional/ADL transfers   OT goals addressed during session: ADL's and self-care      AM-PAC OT "6 Clicks" Daily Activity     Outcome Measure   Help from another person eating meals?: None Help from another person taking care of personal grooming?: A Little Help from another person toileting, which includes using toliet, bedpan, or urinal?: Total Help from another person bathing (including washing, rinsing, drying)?: A Lot Help from another person to put on and taking off regular upper body clothing?: A Lot Help from another person to put on and taking off regular lower body clothing?: Total 6 Click Score: 13    End of Session    OT Visit Diagnosis: Other abnormalities of gait and mobility (R26.89);Muscle weakness (generalized) (M62.81);Pain Pain - Right/Left: Right(L) Pain - part of body: Hip   Activity Tolerance Patient limited by pain   Patient Left in bed;with call bell/phone within reach   Nurse Communication Mobility status;Need for lift equipment;Patient requests pain meds        Time: 5638-7564 OT Time Calculation (min): 36 min  Charges: OT General Charges $OT Visit: 1 Visit OT Treatments $Self Care/Home Management : 8-22 mins  Maurie Boettcher, OT/L   Acute OT Clinical Specialist [place] Pager [phone] Office [phone]    [facility] 12/22/2018, 12:18 PM

## 2018-12-23 DIAGNOSIS — Z6841 Body Mass Index (BMI) 40.0 and over, adult: Secondary | ICD-10-CM

## 2018-12-23 DIAGNOSIS — Z862 Personal history of diseases of the blood and blood-forming organs and certain disorders involving the immune mechanism: Secondary | ICD-10-CM

## 2018-12-23 DIAGNOSIS — E1165 Type 2 diabetes mellitus with hyperglycemia: Secondary | ICD-10-CM

## 2018-12-23 DIAGNOSIS — M25552 Pain in left hip: Secondary | ICD-10-CM

## 2018-12-23 DIAGNOSIS — I890 Lymphedema, not elsewhere classified: Secondary | ICD-10-CM

## 2018-12-23 DIAGNOSIS — E66813 Obesity, class 3: Secondary | ICD-10-CM

## 2018-12-23 DIAGNOSIS — M160 Bilateral primary osteoarthritis of hip: Principal | ICD-10-CM

## 2018-12-23 DIAGNOSIS — D509 Iron deficiency anemia, unspecified: Secondary | ICD-10-CM

## 2018-12-23 DIAGNOSIS — M25551 Pain in right hip: Secondary | ICD-10-CM

## 2018-12-23 DIAGNOSIS — I10 Essential (primary) hypertension: Secondary | ICD-10-CM

## 2018-12-23 DIAGNOSIS — D72829 Elevated white blood cell count, unspecified: Secondary | ICD-10-CM

## 2018-12-23 LAB — BASIC METABOLIC PANEL
Anion gap: 12 (ref 5–15)
BUN: 14 mg/dL (ref 6–20)
CHLORIDE: 98 mmol/L (ref 98–111)
CO2: 28 mmol/L (ref 22–32)
Calcium: 8.9 mg/dL (ref 8.9–10.3)
Creatinine, Ser: 0.64 mg/dL (ref 0.44–1.00)
GFR calc Af Amer: >60 mL/min (ref >60)
GFR calc non Af Amer: >60 mL/min (ref >60)
Glucose, Bld: 140 mg/dL — ABNORMAL HIGH (ref 70–99)
Potassium: 4.1 mmol/L (ref 3.5–5.1)
Sodium: 138 mmol/L (ref 135–145)

## 2018-12-23 LAB — GLUCOSE, CAPILLARY
GLUCOSE-CAPILLARY: 142 mg/dL — AB (ref 70–99)
Glucose-Capillary: 148 mg/dL — ABNORMAL HIGH (ref 70–99)
Glucose-Capillary: 117 mg/dL — ABNORMAL HIGH (ref 70–99)
Glucose-Capillary: 118 mg/dL — ABNORMAL HIGH (ref 70–99)
Glucose-Capillary: 142 mg/dL — ABNORMAL HIGH (ref 70–99)

## 2018-12-23 LAB — CBC
HEMATOCRIT: 32.8 % — AB (ref 36.0–46.0)
HEMOGLOBIN: 10.1 g/dL — AB (ref 12.0–15.0)
MCH: 21.7 pg — ABNORMAL LOW (ref 26.0–34.0)
MCHC: 30.8 g/dL (ref 30.0–36.0)
MCV: 70.4 fL — ABNORMAL LOW (ref 80.0–100.0)
Platelets: 271 10*3/uL (ref 150–400)
RBC: 4.66 MIL/uL (ref 3.87–5.11)
RDW: 15.4 % (ref 11.5–15.5)
WBC: 8.7 10*3/uL (ref 4.0–10.5)
nRBC: 0 % (ref 0.0–0.2)

## 2018-12-23 MED ORDER — METHOCARBAMOL 500 MG PO TABS
500.0000 mg | ORAL_TABLET | Freq: Four times a day (QID) | ORAL | Status: DC | PRN
  Administered 2018-12-23 – 2018-12-25 (×5): 500 mg via ORAL
  Filled 2018-12-23 (×5): qty 1

## 2018-12-23 NOTE — Progress Notes (Signed)
PROGRESS NOTE    Hayley Alvarado  ONG:[id number] DOB: 29-Jan-1956 DOA: 12/19/2018 PCP: Azzie Glatter, FNP   Brief Narrative:  HPI per Dr. Jennette Kettle on 12/19/2018  Hayley Alvarado a 62 y.o.femalewith medical history significant ofHTN, DM2, Lymphedema of BLE. Patient presents to ED with worsening B hip pain for past several months. She has known severe arthritis of both hips. Needs hip replacement, but ortho surgery feels that she is not a candidate due to BMI >40 and lymphedema.  She has progressed from walking with cane, to walker, to wheelchair, to non-ambulatory at home over the past several weeks.  Report of Tm 102 yesterday.  No SOB.  **She was  admitted for ambulatory dysfunction. Found to have sacral ulcer/Coccyx wound. Surgery consulted and starting hydrotherapy.  Still complains of some pain.  T/OT evaluated and recommending skilled nursing facility placement next week done on 12/25/2018.  Assessment & Plan:   Principal Problem:   Osteoarthritis of hips, bilateral Active Problems:   Essential hypertension   Bilateral hip pain   Hx of iron deficiency anemia   Lymphedema   Body mass index 40.0-44.9, adult (HCC)   Lymphedema of both lower extremities   Microcytic hypochromic anemia   Type 2 diabetes mellitus, uncontrolled (HCC)   Pressure injury of skin   Obesity, Class III, BMI 40-49.9 (morbid obesity) (HCC)   Hyperbilirubinemia   Leukocytosis  Ambulatory Dysfunction and Deconditioning secondary to Severe Osteoarthritis of hips  -Patient states she is not able to walk or move -Continue Pain Control with po Acetaminophen 650 mg po q6hprn Mild Pain, Hydrocodone-Acetaminophen 1 tab po q6hprn Moderate Pain/Severe Pain, and IV Hydromorphone 1 mg q4hprn Severe Pain -C/w Ketorolac 30 mg IV q6hprn Moderate Pain  -Bowel Regimen with Senna-Docusate 1 tab po BID -C/w Gabapentin 400 mg po 4 times Daily  -PT and OT recommended SNF  -Social work and case management-  pending placement and will go to [facility] in [place] on 12/25/2018  RLE Pressure Ulcer/Sacral Decubitus Coccyx wound -POA, on Right thigh, with Eschar -POA, sacral decub, Unstageable, but full thickness. Will be a Stage 3 or 4 Pressure injury -Wound care consulted -General surgery consulted and appreciated, recommended hydrotherapy; General Surgery feels wound is cleaning up well with Hydrotherapy and recommending BID wet to dry Dressing changes with Collagenase; No plans for Surgical Intervention currently -Continue pain control as above -Added on muscle relaxant and changed IV Robaxin to po   Lymphadema of B/L LE -Chronic -Stable. Will need referral to Lymphedema Clinic at [facility]  Essential Hypertension -BP was slightly elevated this AM and was 161/69 2/2 to Pain -Continue HCTZ 25 mg po daily   Diabetes Mellitus, Type 2 -Discontinued Metformin and was placed on Sensitive Novolog SSI AC -Last HbA1c was 7.0 on 09/09/2018 so will repeat now -CBG's ranging from 117-165  Morbid Obesity -Estimated body mass index is 43.94 kg/m as calculated from the following:   Height as of this encounter: 5\' 4"  (1.626 m).   Weight as of this encounter: 116.1 kg. -Weight Loss and Dietary Counseling given   Microcytic Anemia -Hb/Hct this Am was 10.1/32.8 and on Admission was 10.3/34.5; MCV low at 70.4 today  -Check Anemia Panel in the AM -C/w Ferrous Sulfate 325 mg po BID for now  -Continue to Monitor for S/Sx of Bleeding -Repeat CBC in AM   Leukocytosis  -Improved. WBC went from 12.3 -> 8.7 -Continue to Monitor for S/Sx of Infection -Repeat CBC in AM   Hyperbilirubinemia -Mild and  likely reactive at 1.3 on admission but repeat not done so will obtain CMP in AM -Continue to Monitor and Trend   Morbid Obesity -Estimated body mass index is 43.94 kg/m as calculated from the following:   Height as of this encounter: 5\' 4"  (1.626 m).   Weight as of this encounter: 116.1 kg. -Weight Loss  and Dietary Counseling given   DVT prophylaxis: Enoxaparin 40 mg sq q24h Code Status: FULL CODE Family Communication: No family present at bedside Disposition Plan: SNF in 2 days at [facility] in [place]  Consultants:   General Surgery   Procedures:  None   Antimicrobials:  Anti-infectives (From admission, onward)   None     Subjective: Seen and examined at bedside and was extremely tearful and complaining of significant right hip pain.  No nausea or vomiting.  Denies any lightheadedness or dizziness.  States that the pain was unbearable in the right compared to left.  No chest pain, shortness of breath or any other complaints or concerns at this time.  Objective: Vitals:   12/22/18 1235 12/22/18 2113 12/23/18 0510 12/23/18 1030  BP: (!) 147/64 126/69 (!) 110/59 (!) 161/69  Pulse: 91 95 85 90  Resp:  15 15 16   Temp: 98.4 F (36.9 C) 98.3 F (36.8 C) 98.3 F (36.8 C) 98.6 F (37 C)  TempSrc:  Oral Oral Oral  SpO2: 99% 92% 99% 100%  Weight:      Height:        Intake/Output Summary (Last 24 hours) at 12/23/2018 1411 Last data filed at 12/23/2018 0900 Gross per 24 hour  Intake 480 ml  Output -  Net 480 ml   Filed Weights   12/19/18 1819  Weight: 116.1 kg   Examination: Physical Exam:  Constitutional: WN/WD morbidly obese AAF who is very tearful and upset visibly in pain and appears uncomfortable Eyes: Lids and conjunctivae normal, sclerae anicteric  ENMT: External Ears, Nose appear normal. Grossly normal hearing. Neck: Appears normal, supple, no cervical masses, normal ROM, no appreciable thyromegaly; no JVD Respiratory: Diminished to auscultation bilaterally, no wheezing, rales, rhonchi or crackles. Normal respiratory effort and patient is not tachypenic. No accessory muscle use.  Cardiovascular: RRR, no murmurs / rubs / gallops. S1 and S2 auscultated. Chronic LE lymphedema  Abdomen: Soft, non-tender, Distended due to body habitus. No masses palpated. No  appreciable hepatosplenomegaly. Bowel sounds positive x4.  GU: Deferred. Musculoskeletal: No clubbing / cyanosis of digits/nails. No joint deformity upper and lower extremities but has chronic lymphedema with skin changes Skin: Has a Pressure ulcer on Right Thigh and a Coccyx/Sacral wound poA. No induration; Warm and dry.  Neurologic: CN 2-12 grossly intact with no focal deficits. Romberg sign and cerebellar reflexes not assessed.  Psychiatric: Normal judgment and insight. Alert and oriented x 3. Tearful mood and appropriate affect.   Data Reviewed: I have personally reviewed following labs and imaging studies  CBC: Recent Labs  Lab 12/19/18 1900 12/23/18 0212  WBC 12.3* 8.7  NEUTROABS 9.3*  --   HGB 10.3* 10.1*  HCT 34.5* 32.8*  MCV 70.8* 70.4*  PLT 198 694   Basic Metabolic Panel: Recent Labs  Lab 12/19/18 1900 12/23/18 0212  NA 138 138  K 4.8 4.1  CL 97* 98  CO2 27 28  GLUCOSE 119* 140*  BUN 23* 14  CREATININE 0.77 0.64  CALCIUM 9.1 8.9   GFR: Estimated Creatinine Clearance: 93.6 mL/min (by C-G formula based on SCr of 0.64 mg/dL). Liver Function Tests:  Recent Labs  Lab 12/19/18 1900  AST 27  ALT 10  ALKPHOS 77  BILITOT 1.3*  PROT 7.3  ALBUMIN 3.5   No results for input(s): LIPASE, AMYLASE in the last 168 hours. No results for input(s): AMMONIA in the last 168 hours. Coagulation Profile: No results for input(s): INR, PROTIME in the last 168 hours. Cardiac Enzymes: No results for input(s): CKTOTAL, CKMB, CKMBINDEX, TROPONINI in the last 168 hours. BNP (last 3 results) No results for input(s): PROBNP in the last 8760 hours. HbA1C: No results for input(s): HGBA1C in the last 72 hours. CBG: Recent Labs  Lab 12/21/18 1130 12/22/18 1620 12/23/18 0141 12/23/18 0711 12/23/18 1141  GLUCAP 126* 165* 148* 117* 118*   Lipid Profile: No results for input(s): CHOL, HDL, LDLCALC, TRIG, CHOLHDL, LDLDIRECT in the last 72 hours. Thyroid Function Tests: No  results for input(s): TSH, T4TOTAL, FREET4, T3FREE, THYROIDAB in the last 72 hours. Anemia Panel: No results for input(s): VITAMINB12, FOLATE, FERRITIN, TIBC, IRON, RETICCTPCT in the last 72 hours. Sepsis Labs: No results for input(s): PROCALCITON, LATICACIDVEN in the last 168 hours.  No results found for this or any previous visit (from the past 240 hour(s)).   RN Pressure Injury Documentation and I am in agreement with the RN's Assessment.  Pressure Injury 12/20/18 Stage II -  Partial thickness loss of dermis presenting as a shallow open ulcer with a red, pink wound bed without slough. (Active)  12/20/18 0030  Location: Sacrum  Location Orientation: Mid;Lower  Staging: Stage II -  Partial thickness loss of dermis presenting as a shallow open ulcer with a red, pink wound bed without slough.  Wound Description (Comments):   Present on Admission: Yes     Pressure Injury 12/20/18 Unstageable - Full thickness tissue loss in which the base of the ulcer is covered by slough (yellow, tan, gray, green or brown) and/or eschar (tan, brown or black) in the wound bed. (Active)  12/20/18 0030  Location: Sacrum  Location Orientation:   Staging: Unstageable - Full thickness tissue loss in which the base of the ulcer is covered by slough (yellow, tan, gray, green or brown) and/or eschar (tan, brown or black) in the wound bed.  Wound Description (Comments):   Present on Admission: Yes     Pressure Injury 12/20/18 Stage II -  Partial thickness loss of dermis presenting as a shallow open ulcer with a red, pink wound bed without slough. (Active)  12/20/18 0030  Location: Buttocks  Location Orientation: Right;Left  Staging: Stage II -  Partial thickness loss of dermis presenting as a shallow open ulcer with a red, pink wound bed without slough.  Wound Description (Comments):   Present on Admission: Yes     Pressure Injury 12/20/18 Unstageable - Full thickness tissue loss in which the base of the ulcer  is covered by slough (yellow, tan, gray, green or brown) and/or eschar (tan, brown or black) in the wound bed. (Active)  12/20/18 0030  Location: Leg  Location Orientation: Right;Lateral  Staging: Unstageable - Full thickness tissue loss in which the base of the ulcer is covered by slough (yellow, tan, gray, green or brown) and/or eschar (tan, brown or black) in the wound bed.  Wound Description (Comments):   Present on Admission: Yes   Radiology Studies: No results found.  Scheduled Meds: . collagenase   Topical Daily  . enoxaparin (LOVENOX) injection  40 mg Subcutaneous Q24H  . ferrous sulfate  325 mg Oral BID WC  . fluticasone  2 spray Each Nare Daily  . gabapentin  400 mg Oral QID  . hydrochlorothiazide  25 mg Oral Daily  . insulin aspart  0-9 Units Subcutaneous TID WC  . potassium chloride SA  20 mEq Oral Daily  . senna-docusate  1 tablet Oral BID   Continuous Infusions:   LOS: 1 day   Kerney Elbe, DO [facility] PAGER is on [place]  If 7PM-7AM, please contact night-coverage [email or web address] Password [facility] 12/23/2018, 2:11 PM

## 2018-12-23 NOTE — Progress Notes (Signed)
  [facility] Progress Note     Subjective: CC-  PT just finished hydrotherapy.  Objective: Vital signs in last 24 hours: Temp:  [98.3 F (36.8 C)-98.4 F (36.9 C)] 98.3 F (36.8 C) (02/12 0510) Pulse Rate:  [85-95] 85 (02/12 0510) Resp:  [15] 15 (02/12 0510) BP: (110-147)/(59-69) 110/59 (02/12 0510) SpO2:  [92 %-99 %] 99 % (02/12 0510) Last BM Date: 12/20/18  Intake/Output from previous day: 02/11 0701 - 02/12 0700 In: 360 [P.O.:360] Out: 500 [Urine:500] Intake/Output this shift: Total I/O In: 360 [P.O.:360] Out: -   PE: Gen:  Alert, NAD, pleasant Pulm:  effort normal GU: wound cleaning up well, less necrotic slough at base, no tunneling, no purulent drainage     Lab Results:  Recent Labs    12/23/18 0212  WBC 8.7  HGB 10.1*  HCT 32.8*  PLT 271   BMET Recent Labs    12/23/18 0212  NA 138  K 4.1  CL 98  CO2 28  GLUCOSE 140*  BUN 14  CREATININE 0.64  CALCIUM 8.9   PT/INR No results for input(s): LABPROT, INR in the last 72 hours. CMP     Component Value Date/Time   NA 138 12/23/2018 0212   K 4.1 12/23/2018 0212   CL 98 12/23/2018 0212   CO2 28 12/23/2018 0212   GLUCOSE 140 (H) 12/23/2018 0212   BUN 14 12/23/2018 0212   CREATININE 0.64 12/23/2018 0212   CREATININE 0.85 01/31/2017 1518   CALCIUM 8.9 12/23/2018 0212   PROT 7.3 12/19/2018 1900   ALBUMIN 3.5 12/19/2018 1900   AST 27 12/19/2018 1900   ALT 10 12/19/2018 1900   ALKPHOS 77 12/19/2018 1900   BILITOT 1.3 (H) 12/19/2018 1900   GFRNONAA >60 12/23/2018 0212   GFRAA >60 12/23/2018 0212   Lipase  No results found for: LIPASE     Studies/Results: No results found.  Anti-infectives: Anti-infectives (From admission, onward)   None       Assessment/Plan DM HTN BLE lymphedema Severe bilateral hip osteoarthritis, nonambulatory  Coccyx wound - Cleaning up well with hydrotherapy and BID wet to dry dressing changes with santyl. No plans for surgical  intervention. Continue current plan of care.  ID - none VTE - lovenox FEN - CM diet Foley - wick Follow up - TBD   LOS: 1 day    Wellington Hampshire , [facility] 12/23/2018, 9:52 AM Pager: [phone] Mon-Thurs 7:00 am-4:30 pm Fri 7:00 am -11:30 AM Sat-Sun 7:00 am-11:30 am

## 2018-12-23 NOTE — Progress Notes (Signed)
Physical Therapy Wound Treatment Patient Details  Name: Hayley Alvarado MRN: [id number] Date of Birth: [birth year]  Today's Date: 12/23/2018 Time: 0930-1000 Time Calculation (min): 30 min  Subjective  Patient and Family Stated Goals: To heal wound Date of Onset: (unknown) Prior Treatments: none  Pain Score: Pain with positioning; Pre medicated  Wound Assessment     Pressure Injury 12/20/18 Unstageable - Full thickness tissue loss in which the base of the ulcer is covered by slough (yellow, tan, gray, green or brown) and/or eschar (tan, brown or black) in the wound bed. (Active)  Dressing Type Foam;Gauze (Comment) 12/23/2018 11:16 AM  Dressing Clean;Dry;Intact 12/23/2018 11:16 AM  Dressing Change Frequency Twice a day 12/23/2018 11:16 AM  State of Healing Early/partial granulation 12/23/2018 11:16 AM  Site / Wound Assessment Brown;Red;Yellow 12/23/2018 11:16 AM  % Wound base Red or Granulating 70% 12/23/2018 11:16 AM  % Wound base Yellow/Fibrinous Exudate 20% 12/23/2018 11:16 AM  % Wound base Black/Eschar 10% 12/23/2018 11:16 AM  % Wound base Other/Granulation Tissue (Comment) 0% 12/23/2018 11:16 AM  Peri-wound Assessment Maceration 12/23/2018 11:16 AM  Wound Length (cm) 3.2 cm 12/22/2018 11:22 AM  Wound Width (cm) 3 cm 12/22/2018 11:22 AM  Wound Depth (cm) 2 cm 12/22/2018 11:22 AM  Wound Surface Area (cm^2) 9.6 cm^2 12/22/2018 11:22 AM  Wound Volume (cm^3) 19.2 cm^3 12/22/2018 11:22 AM  Tunneling (cm) 0 12/22/2018 11:22 AM  Undermining (cm) 0 12/22/2018 11:22 AM  Margins Unattached edges (unapproximated) 12/23/2018 11:16 AM  Drainage Amount Minimal 12/23/2018 11:16 AM  Drainage Description Serosanguineous 12/23/2018 11:16 AM  Treatment Debridement (Selective);Hydrotherapy (Pulse lavage);Packing (Saline gauze);Tape changed 12/23/2018 11:16 AM      Hydrotherapy Pulsed lavage therapy - wound location: sacrum Pulsed Lavage with Suction (psi): 12 psi Pulsed Lavage with Suction - Normal Saline Used:  1000 mL Pulsed Lavage Tip: Tip with splash shield Selective Debridement Selective Debridement - Location: sacrum Selective Debridement - Tools Used: Forceps;Scissors Selective Debridement - Tissue Removed: slough   Wound Assessment and Plan  Wound Therapy - Assess/Plan/Recommendations Wound Therapy - Clinical Statement: Pt presenting with increased red/granulation tissue and able to remove minimal slough. Pt will benefit from continued hydrotherapy to cleanse wound and promote healing. Wound Therapy - Functional Problem List: open wound; immobility Factors Delaying/Impairing Wound Healing: Diabetes Mellitus;Immobility Hydrotherapy Plan: Debridement;Pulsatile lavage with suction;Patient/family education;Dressing change Wound Therapy - Frequency: 6X / week Wound Therapy - Follow Up Recommendations: Skilled nursing facility Wound Plan: see above  Wound Therapy Goals- Improve the function of patient's integumentary system by progressing the wound(s) through the phases of wound healing (inflammation - proliferation - remodeling) by: Decrease Necrotic Tissue to: 20 Decrease Necrotic Tissue - Progress: Met Increase Granulation Tissue to: 80 Increase Granulation Tissue - Progress: Met Decrease Length/Width/Depth by (cm): Depth by 0.5 cm Decrease Length/Width/Depth - Progress: Progressing toward goal Goals/treatment plan/discharge plan were made with and agreed upon by patient/family: Yes Time For Goal Achievement: 2 weeks Wound Therapy - Potential for Goals: Good  Goals will be updated until maximal potential achieved or discharge criteria met.  Discharge criteria: when goals achieved, discharge from hospital, MD decision/surgical intervention, no progress towards goals, refusal/missing three consecutive treatments without notification or medical reason.  GP     Ellamae Sia, PT, DPT Acute Rehabilitation Services Pager [phone] Office [phone]  Willy Eddy 12/23/2018,  11:44 AM

## 2018-12-23 NOTE — Progress Notes (Signed)
CSW confirmed with patient she wishes to discharge to [facility] in [place] when medically ready, patient's 3 night inpatient stay with medicare will be possible dc Friday 2/14 if medically ready. Patient gave CSW permission to contact daughter in [place] with any updates and anticipated dc.   CSW will continue to communicate with [facility] in [place] to ensure bed avail when patient medically ready to dc.   [place], [place]

## 2018-12-23 NOTE — Progress Notes (Signed)
Pt refused to turn several times last night. Education provided. Pain medication administered frequently. Will continue to monitor.

## 2018-12-24 LAB — CBC WITH DIFFERENTIAL/PLATELET
Abs Immature Granulocytes: 0.03 10*3/uL (ref 0.00–0.07)
Basophils Absolute: 0.1 10*3/uL (ref 0.0–0.1)
Basophils Relative: 1 %
Eosinophils Absolute: 0.6 10*3/uL — ABNORMAL HIGH (ref 0.0–0.5)
Eosinophils Relative: 7 %
HCT: 33 % — ABNORMAL LOW (ref 36.0–46.0)
Hemoglobin: 10.2 g/dL — ABNORMAL LOW (ref 12.0–15.0)
Immature Granulocytes: 0 %
Lymphocytes Relative: 25 %
Lymphs Abs: 2.2 10*3/uL (ref 0.7–4.0)
MCH: 21.7 pg — ABNORMAL LOW (ref 26.0–34.0)
MCHC: 30.9 g/dL (ref 30.0–36.0)
MCV: 70.2 fL — ABNORMAL LOW (ref 80.0–100.0)
Monocytes Absolute: 0.8 10*3/uL (ref 0.1–1.0)
Monocytes Relative: 9 %
Neutro Abs: 4.9 10*3/uL (ref 1.7–7.7)
Neutrophils Relative %: 58 %
Platelets: 309 10*3/uL (ref 150–400)
RBC: 4.7 MIL/uL (ref 3.87–5.11)
RDW: 15.3 % (ref 11.5–15.5)
WBC: 8.5 10*3/uL (ref 4.0–10.5)
nRBC: 0.2 % (ref 0.0–0.2)

## 2018-12-24 LAB — COMPREHENSIVE METABOLIC PANEL
ALT: 17 U/L (ref 0–44)
ANION GAP: 11 (ref 5–15)
AST: 14 U/L — ABNORMAL LOW (ref 15–41)
Albumin: 3.1 g/dL — ABNORMAL LOW (ref 3.5–5.0)
Alkaline Phosphatase: 65 U/L (ref 38–126)
BUN: 13 mg/dL (ref 6–20)
CO2: 29 mmol/L (ref 22–32)
Calcium: 9.2 mg/dL (ref 8.9–10.3)
Chloride: 99 mmol/L (ref 98–111)
Creatinine, Ser: 0.6 mg/dL (ref 0.44–1.00)
GFR calc Af Amer: >60 mL/min (ref >60)
GFR calc non Af Amer: >60 mL/min (ref >60)
Glucose, Bld: 119 mg/dL — ABNORMAL HIGH (ref 70–99)
Potassium: 4.2 mmol/L (ref 3.5–5.1)
Sodium: 139 mmol/L (ref 135–145)
Total Bilirubin: 0.5 mg/dL (ref 0.3–1.2)
Total Protein: 6.6 g/dL (ref 6.5–8.1)

## 2018-12-24 LAB — FERRITIN: Ferritin: 249 ng/mL (ref 11–307)

## 2018-12-24 LAB — VITAMIN B12: Vitamin B-12: 440 pg/mL (ref 180–914)

## 2018-12-24 LAB — IRON AND TIBC
Iron: 38 ug/dL (ref 28–170)
Saturation Ratios: 16 % (ref 10.4–31.8)
TIBC: 231 ug/dL — ABNORMAL LOW (ref 250–450)
UIBC: 193 ug/dL

## 2018-12-24 LAB — MAGNESIUM: Magnesium: 2 mg/dL (ref 1.7–2.4)

## 2018-12-24 LAB — RETICULOCYTES
Immature Retic Fract: 31.7 % — ABNORMAL HIGH (ref 2.3–15.9)
RBC.: 4.7 MIL/uL (ref 3.87–5.11)
Retic Count, Absolute: 71.4 10*3/uL (ref 19.0–186.0)
Retic Ct Pct: 1.5 % (ref 0.4–3.1)

## 2018-12-24 LAB — GLUCOSE, CAPILLARY
Glucose-Capillary: 110 mg/dL — ABNORMAL HIGH (ref 70–99)
Glucose-Capillary: 134 mg/dL — ABNORMAL HIGH (ref 70–99)
Glucose-Capillary: 145 mg/dL — ABNORMAL HIGH (ref 70–99)
Glucose-Capillary: 127 mg/dL — ABNORMAL HIGH (ref 70–99)

## 2018-12-24 LAB — FOLATE: Folate: 11.7 ng/mL (ref >5.9)

## 2018-12-24 LAB — PHOSPHORUS: Phosphorus: 4.4 mg/dL (ref 2.5–4.6)

## 2018-12-24 MED ORDER — HYDROCODONE-ACETAMINOPHEN 5-325 MG PO TABS
1.0000 | ORAL_TABLET | ORAL | Status: DC | PRN
  Administered 2018-12-24 – 2018-12-25 (×2): 1 via ORAL
  Filled 2018-12-24 (×3): qty 1

## 2018-12-24 NOTE — Progress Notes (Addendum)
Physical Therapy Wound Treatment Patient Details  Name: Hayley Alvarado MRN: [id number] Date of Birth: 1956/03/21  Today's Date: 12/24/2018 Time: 1027-2536 Time Calculation (min): 29 min  Subjective  Subjective: "I smelled it for this first time today." Pt referring to wound Patient and Family Stated Goals: To heal wound Date of Onset: (unknown) Prior Treatments: none  Pain Score: Pain with positioning; Pre medicated  Wound Assessment     Pressure Injury 12/20/18 Unstageable - Full thickness tissue loss in which the base of the ulcer is covered by slough (yellow, tan, gray, green or brown) and/or eschar (tan, brown or black) in the wound bed. (Active)  Dressing Type Foam;Gauze (Comment) 12/24/2018 11:43 AM  Dressing Clean;Dry;Intact 12/24/2018 11:43 AM  Dressing Change Frequency Twice a day 12/24/2018 11:43 AM  State of Healing Early/partial granulation 12/24/2018 11:43 AM  Site / Wound Assessment Brown;Red;Yellow 12/24/2018 11:43 AM  % Wound base Red or Granulating 70% 12/24/2018 11:43 AM  % Wound base Yellow/Fibrinous Exudate 20% 12/24/2018 11:43 AM  % Wound base Black/Eschar 10% 12/24/2018 11:43 AM  % Wound base Other/Granulation Tissue (Comment) 0% 12/24/2018 11:43 AM  Peri-wound Assessment Maceration 12/24/2018 11:43 AM  Wound Length (cm) 3.2 cm 12/22/2018 11:22 AM  Wound Width (cm) 3 cm 12/22/2018 11:22 AM  Wound Depth (cm) 2 cm 12/22/2018 11:22 AM  Wound Surface Area (cm^2) 9.6 cm^2 12/22/2018 11:22 AM  Wound Volume (cm^3) 19.2 cm^3 12/22/2018 11:22 AM  Tunneling (cm) 0 12/22/2018 11:22 AM  Undermining (cm) 0 12/22/2018 11:22 AM  Margins Unattached edges (unapproximated) 12/24/2018 11:43 AM  Drainage Amount Minimal 12/24/2018 11:43 AM  Drainage Description Serosanguineous 12/24/2018 11:43 AM  Treatment Debridement (Selective);Hydrotherapy (Pulse lavage);Packing (Saline gauze);Tape changed 12/24/2018 11:43 AM      Hydrotherapy Pulsed lavage therapy - wound location: sacrum Pulsed Lavage  with Suction (psi): 12 psi Pulsed Lavage with Suction - Normal Saline Used: 1000 mL Pulsed Lavage Tip: Tip with splash shield Selective Debridement Selective Debridement - Location: sacrum Selective Debridement - Tools Used: Forceps;Scissors Selective Debridement - Tissue Removed: slough   Wound Assessment and Plan  Wound Therapy - Assess/Plan/Recommendations Wound Therapy - Clinical Statement: Continues with increased red/granulation wound bed and able to remove minimal slough/eschar. Pt will benefit from continued hydrotherapy to cleanse wound and promote healing. Wound Therapy - Functional Problem List: open wound; immobility Factors Delaying/Impairing Wound Healing: Diabetes Mellitus;Immobility Hydrotherapy Plan: Debridement;Pulsatile lavage with suction;Patient/family education;Dressing change Wound Therapy - Frequency: 6X / week Wound Therapy - Follow Up Recommendations: Skilled nursing facility Wound Plan: see above  Wound Therapy Goals- Improve the function of patient's integumentary system by progressing the wound(s) through the phases of wound healing (inflammation - proliferation - remodeling) by: Decrease Necrotic Tissue to: 20 Decrease Necrotic Tissue - Progress: Progressing toward goal Increase Granulation Tissue to: 80 Increase Granulation Tissue - Progress: Progressing toward goal Decrease Length/Width/Depth by (cm): Depth by 0.5 cm Decrease Length/Width/Depth - Progress: Progressing toward goal Goals/treatment plan/discharge plan were made with and agreed upon by patient/family: Yes Time For Goal Achievement: 2 weeks Wound Therapy - Potential for Goals: Good  Goals will be updated until maximal potential achieved or discharge criteria met.  Discharge criteria: when goals achieved, discharge from hospital, MD decision/surgical intervention, no progress towards goals, refusal/missing three consecutive treatments without notification or medical reason.  GP    Ellamae Sia, PT, DPT Acute Rehabilitation Services Pager [phone] Office [phone]   Willy Eddy 12/24/2018, 11:47 AM

## 2018-12-24 NOTE — Progress Notes (Signed)
PROGRESS NOTE    Hayley Alvarado  EXH:[id number] DOB: 10-Jun-1956 DOA: 12/19/2018 PCP: Azzie Glatter, FNP   Brief Narrative:  HPI per Dr. Jennette Kettle on 12/19/2018  Hayley Alvarado a 63 y.o.femalewith medical history significant ofHTN, DM2, Lymphedema of BLE. Patient presents to ED with worsening B hip pain for past several months. She has known severe arthritis of both hips. Needs hip replacement, but ortho surgery feels that she is not a candidate due to BMI >40 and lymphedema.  She has progressed from walking with cane, to walker, to wheelchair, to non-ambulatory at home over the past several weeks.  Report of Tm 102 yesterday.  No SOB.  **She was  admitted for ambulatory dysfunction. Found to have sacral ulcer/Coccyx wound. Surgery consulted and starting hydrotherapy.  Still complains of some pain and it was a 7/10 today.  PT/OT evaluated and recommending skilled nursing facility placement next week done on 12/25/2018.  Assessment & Plan:   Principal Problem:   Osteoarthritis of hips, bilateral Active Problems:   Essential hypertension   Bilateral hip pain   Hx of iron deficiency anemia   Lymphedema   Body mass index 40.0-44.9, adult (HCC)   Lymphedema of both lower extremities   Microcytic hypochromic anemia   Type 2 diabetes mellitus, uncontrolled (HCC)   Pressure injury of skin   Obesity, Class III, BMI 40-49.9 (morbid obesity) (HCC)   Hyperbilirubinemia   Leukocytosis  Ambulatory Dysfunction and Deconditioning secondary to Severe Osteoarthritis of hips  -Patient states she is not able to walk or move -Continue Pain Control with po Acetaminophen 650 mg po q6hprn Mild Pain, Hydrocodone-Acetaminophen 1 tab po q6hprn Moderate Pain/Severe Pain changed to q4hprn, and IV Hydromorphone 1 mg q4hprn Severe Pain -C/w Ketorolac 30 mg IV q6hprn Moderate Pain for now -Bowel Regimen with Senna-Docusate 1 tab po BID -C/w Gabapentin 400 mg po 4 times Daily  -PT and OT  recommended SNF  -Social work and case management- pending placement and will go to [facility] in [place] on 12/25/2018  RLE Pressure Ulcer/Sacral Decubitus Coccyx wound -POA, on Right thigh, with Eschar -POA, sacral decub, Unstageable, but full thickness. Will be a Stage 3 or 4 Pressure injury -Wound care consulted -General surgery consulted and appreciated, recommended hydrotherapy; General Surgery feels wound is cleaning up well with Hydrotherapy and recommending BID wet to dry Dressing changes with Collagenase; No plans for Surgical Intervention currently -Continue pain control as above -Added on muscle relaxant and changed IV Robaxin to po   Lymphadema of B/L LE -Chronic -Stable. Will need referral to Lymphedema Clinic at D/C and states they can do it at the [facility] in [place] Hypertension -BP was slightly elevated this AM and was 150/77 2/2 to Pain -Continue HCTZ 25 mg po daily   Diabetes Mellitus, Type 2 -Discontinued Metformin and was placed on Sensitive Novolog SSI AC -Last HbA1c was 7.0 on 09/09/2018 so will repeat now -CBG's ranging from 110-134  Morbid Obesity -Estimated body mass index is 43.94 kg/m as calculated from the following:   Height as of this encounter: 5\' 4"  (1.626 m).   Weight as of this encounter: 116.1 kg. -Weight Loss and Dietary Counseling given   Microcytic Anemia -Hb/Hct this Am was 10.2/33.0 and on Admission was 10.3/34.5; MCV low at 70.2 today  -[place] Anemia Panel and showed a level of 38, U IBC of 193, TIBC of 231, saturation ratios of 16%, ferritin level of 249, folate level 11.7, and vitamin B12 level 440 -  C/w Ferrous Sulfate 325 mg po BID for now  -Continue to Monitor for S/Sx of Bleeding -Repeat CBC in AM   Leukocytosis  -Improved. WBC went from 12.3 -> 8.7 -> 0.5 -Continue to Monitor for S/Sx of Infection -Repeat CBC in AM   Hyperbilirubinemia -Mild and likely reactive at 1.3 on admission; now improved and is  0.5 -We will not check CMP's anymore given that it is trended down  Morbid Obesity -Estimated body mass index is 43.94 kg/m as calculated from the following:   Height as of this encounter: 5\' 4"  (1.626 m).   Weight as of this encounter: 116.1 kg. -Weight Loss and Dietary Counseling given   DVT prophylaxis: Enoxaparin 40 mg sq q24h Code Status: FULL CODE Family Communication: No family present at bedside Disposition Plan: SNF in 2 days at [facility] in [place]  Consultants:   General Surgery   Procedures:  None   Antimicrobials:  Anti-infectives (From admission, onward)   None     Subjective: Seen and examined at bedside states that she did not get very much sleep last night.  Still complaining of some pain but not as bad as yesterday.  States it is 7 out of 10 today.  No nausea or vomiting.  No other concerns or complaints at this time and ready to go to the [facility].  Objective: Vitals:   12/23/18 1414 12/23/18 2100 12/24/18 0423 12/24/18 0846  BP: (!) 154/72 (!) 149/77 135/76 (!) 150/77  Pulse: 91 84 80 96  Resp: 16 20  16   Temp: 98.2 F (36.8 C) 98.2 F (36.8 C) 98 F (36.7 C) 98.2 F (36.8 C)  TempSrc: Oral Oral Oral Oral  SpO2: 96% 97% 99% 96%  Weight:      Height:        Intake/Output Summary (Last 24 hours) at 12/24/2018 1201 Last data filed at 12/24/2018 0900 Gross per 24 hour  Intake 480 ml  Output 600 ml  Net -120 ml   Filed Weights   12/19/18 1819  Weight: 116.1 kg   Examination: Physical Exam:  Constitutional: Well-nourished, well-developed morbidly obese African-American female who is currently no acute distress but is complaining of some pain which is rated 7 out of 10 Eyes: Sclera anicteric.  Lids and conjunctive are normal. ENMT: External ears and nose appear normal.  Grossly normal hearing Neck: Appears supple no JVD Respiratory: Slightly diminished auscultation bilaterally with no appreciable wheezing, rales or rhonchi.   Patient not tachypneic wheezing any accessory muscles to breathe. Cardiovascular: Regular rate and rhythm.  No appreciable murmurs, rubs or gallops.  Has chronic lower extremity lymphedema Abdomen: Soft, nontender, distended secondary body habitus.  Bowel sounds present GU: Deferred Musculoskeletal: No clubbing or cyanosis.  But she does like lymphedema with skin changes. Skin: Has a pressure ulcer on the right thigh and coccyx sacral wound which is present on admission.  Skin is warm and dry.  No appreciable rashes or lesions but does have chronic lymphedema changes Neurologic: Cranial nerves II through XII grossly intact.  No appreciable focal deficits. Psychiatric: Normal judgment and insight.  Is awake and alert and oriented.  She has a pleasant mood and affect today.  Data Reviewed: I have personally reviewed following labs and imaging studies  CBC: Recent Labs  Lab 12/19/18 1900 12/23/18 0212 12/24/18 0318  WBC 12.3* 8.7 8.5  NEUTROABS 9.3*  --  4.9  HGB 10.3* 10.1* 10.2*  HCT 34.5* 32.8* 33.0*  MCV 70.8* 70.4* 70.2*  PLT 198 271 338   Basic Metabolic Panel: Recent Labs  Lab 12/19/18 1900 12/23/18 0212 12/24/18 0318  NA 138 138 139  K 4.8 4.1 4.2  CL 97* 98 99  CO2 27 28 29   GLUCOSE 119* 140* 119*  BUN 23* 14 13  CREATININE 0.77 0.64 0.60  CALCIUM 9.1 8.9 9.2  MG  --   --  2.0  PHOS  --   --  4.4   GFR: Estimated Creatinine Clearance: 93.6 mL/min (by C-G formula based on SCr of 0.6 mg/dL). Liver Function Tests: Recent Labs  Lab 12/19/18 1900 12/24/18 0318  AST 27 14*  ALT 10 17  ALKPHOS 77 65  BILITOT 1.3* 0.5  PROT 7.3 6.6  ALBUMIN 3.5 3.1*   No results for input(s): LIPASE, AMYLASE in the last 168 hours. No results for input(s): AMMONIA in the last 168 hours. Coagulation Profile: No results for input(s): INR, PROTIME in the last 168 hours. Cardiac Enzymes: No results for input(s): CKTOTAL, CKMB, CKMBINDEX, TROPONINI in the last 168 hours. BNP (last  3 results) No results for input(s): PROBNP in the last 8760 hours. HbA1C: No results for input(s): HGBA1C in the last 72 hours. CBG: Recent Labs  Lab 12/23/18 1141 12/23/18 1601 12/23/18 2107 12/24/18 0629 12/24/18 1119  GLUCAP 118* 142* 142* 110* 134*   Lipid Profile: No results for input(s): CHOL, HDL, LDLCALC, TRIG, CHOLHDL, LDLDIRECT in the last 72 hours. Thyroid Function Tests: No results for input(s): TSH, T4TOTAL, FREET4, T3FREE, THYROIDAB in the last 72 hours. Anemia Panel: Recent Labs    12/24/18 0318  VITAMINB12 440  FOLATE 11.7  FERRITIN 249  TIBC 231*  IRON 38  RETICCTPCT 1.5   Sepsis Labs: No results for input(s): PROCALCITON, LATICACIDVEN in the last 168 hours.  No results found for this or any previous visit (from the past 240 hour(s)).   RN Pressure Injury Documentation and I am in agreement with the RN's Assessment.  Pressure Injury 12/20/18 Stage II -  Partial thickness loss of dermis presenting as a shallow open ulcer with a red, pink wound bed without slough. (Active)  12/20/18 0030  Location: Sacrum  Location Orientation: Mid;Lower  Staging: Stage II -  Partial thickness loss of dermis presenting as a shallow open ulcer with a red, pink wound bed without slough.  Wound Description (Comments):   Present on Admission: Yes     Pressure Injury 12/20/18 Unstageable - Full thickness tissue loss in which the base of the ulcer is covered by slough (yellow, tan, gray, green or brown) and/or eschar (tan, brown or black) in the wound bed. (Active)  12/20/18 0030  Location: Sacrum  Location Orientation:   Staging: Unstageable - Full thickness tissue loss in which the base of the ulcer is covered by slough (yellow, tan, gray, green or brown) and/or eschar (tan, brown or black) in the wound bed.  Wound Description (Comments):   Present on Admission: Yes     Pressure Injury 12/20/18 Stage II -  Partial thickness loss of dermis presenting as a shallow open  ulcer with a red, pink wound bed without slough. (Active)  12/20/18 0030  Location: Buttocks  Location Orientation: Right;Left  Staging: Stage II -  Partial thickness loss of dermis presenting as a shallow open ulcer with a red, pink wound bed without slough.  Wound Description (Comments):   Present on Admission: Yes     Pressure Injury 12/20/18 Unstageable - Full thickness tissue loss in which the base of  the ulcer is covered by slough (yellow, tan, gray, green or brown) and/or eschar (tan, brown or black) in the wound bed. (Active)  12/20/18 0030  Location: Leg  Location Orientation: Right;Lateral  Staging: Unstageable - Full thickness tissue loss in which the base of the ulcer is covered by slough (yellow, tan, gray, green or brown) and/or eschar (tan, brown or black) in the wound bed.  Wound Description (Comments):   Present on Admission: Yes   Radiology Studies: No results found.  Scheduled Meds: . collagenase   Topical Daily  . enoxaparin (LOVENOX) injection  40 mg Subcutaneous Q24H  . ferrous sulfate  325 mg Oral BID WC  . fluticasone  2 spray Each Nare Daily  . gabapentin  400 mg Oral QID  . hydrochlorothiazide  25 mg Oral Daily  . insulin aspart  0-9 Units Subcutaneous TID WC  . potassium chloride SA  20 mEq Oral Daily  . senna-docusate  1 tablet Oral BID   Continuous Infusions:   LOS: 2 days   Kerney Elbe, DO [facility] PAGER is on AMION  If 7PM-7AM, please contact night-coverage [email or web address] Password [facility] 12/24/2018, 12:01 PM

## 2018-12-24 NOTE — Plan of Care (Signed)
  Problem: Education: Goal: Knowledge of General Education information will improve Description: Including pain rating scale, medication(s)/side effects and non-pharmacologic comfort measures Outcome: Progressing   Problem: Nutrition: Goal: Adequate nutrition will be maintained Outcome: Progressing   Problem: Safety: Goal: Ability to remain free from injury will improve Outcome: Progressing   

## 2018-12-25 ENCOUNTER — Telehealth: Payer: Self-pay

## 2018-12-25 DIAGNOSIS — N950 Postmenopausal bleeding: Secondary | ICD-10-CM | POA: Diagnosis not present

## 2018-12-25 DIAGNOSIS — A419 Sepsis, unspecified organism: Secondary | ICD-10-CM | POA: Diagnosis present

## 2018-12-25 DIAGNOSIS — R824 Acetonuria: Secondary | ICD-10-CM | POA: Diagnosis not present

## 2018-12-25 DIAGNOSIS — L98429 Non-pressure chronic ulcer of back with unspecified severity: Secondary | ICD-10-CM | POA: Diagnosis not present

## 2018-12-25 DIAGNOSIS — D250 Submucous leiomyoma of uterus: Secondary | ICD-10-CM | POA: Diagnosis present

## 2018-12-25 DIAGNOSIS — I83012 Varicose veins of right lower extremity with ulcer of calf: Secondary | ICD-10-CM | POA: Diagnosis not present

## 2018-12-25 DIAGNOSIS — M25551 Pain in right hip: Secondary | ICD-10-CM | POA: Diagnosis not present

## 2018-12-25 DIAGNOSIS — N170 Acute kidney failure with tubular necrosis: Secondary | ICD-10-CM | POA: Diagnosis not present

## 2018-12-25 DIAGNOSIS — D509 Iron deficiency anemia, unspecified: Secondary | ICD-10-CM | POA: Diagnosis not present

## 2018-12-25 DIAGNOSIS — N939 Abnormal uterine and vaginal bleeding, unspecified: Secondary | ICD-10-CM | POA: Diagnosis not present

## 2018-12-25 DIAGNOSIS — E1169 Type 2 diabetes mellitus with other specified complication: Secondary | ICD-10-CM | POA: Diagnosis present

## 2018-12-25 DIAGNOSIS — M6281 Muscle weakness (generalized): Secondary | ICD-10-CM | POA: Diagnosis not present

## 2018-12-25 DIAGNOSIS — Z79899 Other long term (current) drug therapy: Secondary | ICD-10-CM | POA: Diagnosis not present

## 2018-12-25 DIAGNOSIS — G43909 Migraine, unspecified, not intractable, without status migrainosus: Secondary | ICD-10-CM | POA: Diagnosis not present

## 2018-12-25 DIAGNOSIS — R0902 Hypoxemia: Secondary | ICD-10-CM | POA: Diagnosis not present

## 2018-12-25 DIAGNOSIS — L22 Diaper dermatitis: Secondary | ICD-10-CM | POA: Diagnosis not present

## 2018-12-25 DIAGNOSIS — Z7409 Other reduced mobility: Secondary | ICD-10-CM | POA: Diagnosis not present

## 2018-12-25 DIAGNOSIS — H5712 Ocular pain, left eye: Secondary | ICD-10-CM | POA: Diagnosis not present

## 2018-12-25 DIAGNOSIS — R251 Tremor, unspecified: Secondary | ICD-10-CM | POA: Diagnosis not present

## 2018-12-25 DIAGNOSIS — E872 Acidosis: Secondary | ICD-10-CM | POA: Diagnosis not present

## 2018-12-25 DIAGNOSIS — Z6841 Body Mass Index (BMI) 40.0 and over, adult: Secondary | ICD-10-CM | POA: Diagnosis not present

## 2018-12-25 DIAGNOSIS — D72829 Elevated white blood cell count, unspecified: Secondary | ICD-10-CM | POA: Diagnosis not present

## 2018-12-25 DIAGNOSIS — R5381 Other malaise: Secondary | ICD-10-CM | POA: Diagnosis not present

## 2018-12-25 DIAGNOSIS — R5383 Other fatigue: Secondary | ICD-10-CM | POA: Diagnosis present

## 2018-12-25 DIAGNOSIS — R269 Unspecified abnormalities of gait and mobility: Secondary | ICD-10-CM | POA: Diagnosis not present

## 2018-12-25 DIAGNOSIS — L89890 Pressure ulcer of other site, unstageable: Secondary | ICD-10-CM | POA: Diagnosis not present

## 2018-12-25 DIAGNOSIS — T7621XA Adult sexual abuse, suspected, initial encounter: Secondary | ICD-10-CM | POA: Diagnosis present

## 2018-12-25 DIAGNOSIS — T797XXA Traumatic subcutaneous emphysema, initial encounter: Secondary | ICD-10-CM | POA: Diagnosis not present

## 2018-12-25 DIAGNOSIS — R58 Hemorrhage, not elsewhere classified: Secondary | ICD-10-CM | POA: Diagnosis not present

## 2018-12-25 DIAGNOSIS — I890 Lymphedema, not elsewhere classified: Secondary | ICD-10-CM | POA: Diagnosis not present

## 2018-12-25 DIAGNOSIS — F4310 Post-traumatic stress disorder, unspecified: Secondary | ICD-10-CM | POA: Diagnosis not present

## 2018-12-25 DIAGNOSIS — Z751 Person awaiting admission to adequate facility elsewhere: Secondary | ICD-10-CM | POA: Diagnosis not present

## 2018-12-25 DIAGNOSIS — I10 Essential (primary) hypertension: Secondary | ICD-10-CM | POA: Diagnosis not present

## 2018-12-25 DIAGNOSIS — R52 Pain, unspecified: Secondary | ICD-10-CM | POA: Diagnosis not present

## 2018-12-25 DIAGNOSIS — M4628 Osteomyelitis of vertebra, sacral and sacrococcygeal region: Secondary | ICD-10-CM | POA: Diagnosis present

## 2018-12-25 DIAGNOSIS — F419 Anxiety disorder, unspecified: Secondary | ICD-10-CM | POA: Diagnosis not present

## 2018-12-25 DIAGNOSIS — D259 Leiomyoma of uterus, unspecified: Secondary | ICD-10-CM | POA: Diagnosis not present

## 2018-12-25 DIAGNOSIS — E119 Type 2 diabetes mellitus without complications: Secondary | ICD-10-CM | POA: Diagnosis not present

## 2018-12-25 DIAGNOSIS — E876 Hypokalemia: Secondary | ICD-10-CM | POA: Diagnosis not present

## 2018-12-25 DIAGNOSIS — E878 Other disorders of electrolyte and fluid balance, not elsewhere classified: Secondary | ICD-10-CM | POA: Diagnosis not present

## 2018-12-25 DIAGNOSIS — A1801 Tuberculosis of spine: Secondary | ICD-10-CM | POA: Diagnosis present

## 2018-12-25 DIAGNOSIS — M2550 Pain in unspecified joint: Secondary | ICD-10-CM | POA: Diagnosis not present

## 2018-12-25 DIAGNOSIS — L8995 Pressure ulcer of unspecified site, unstageable: Secondary | ICD-10-CM | POA: Diagnosis not present

## 2018-12-25 DIAGNOSIS — D638 Anemia in other chronic diseases classified elsewhere: Secondary | ICD-10-CM | POA: Diagnosis present

## 2018-12-25 DIAGNOSIS — E1165 Type 2 diabetes mellitus with hyperglycemia: Secondary | ICD-10-CM | POA: Diagnosis not present

## 2018-12-25 DIAGNOSIS — Z7401 Bed confinement status: Secondary | ICD-10-CM | POA: Diagnosis not present

## 2018-12-25 DIAGNOSIS — M25552 Pain in left hip: Secondary | ICD-10-CM | POA: Diagnosis not present

## 2018-12-25 DIAGNOSIS — M160 Bilateral primary osteoarthritis of hip: Secondary | ICD-10-CM | POA: Diagnosis not present

## 2018-12-25 DIAGNOSIS — L89152 Pressure ulcer of sacral region, stage 2: Secondary | ICD-10-CM | POA: Diagnosis not present

## 2018-12-25 DIAGNOSIS — D559 Anemia due to enzyme disorder, unspecified: Secondary | ICD-10-CM | POA: Diagnosis not present

## 2018-12-25 DIAGNOSIS — Z862 Personal history of diseases of the blood and blood-forming organs and certain disorders involving the immune mechanism: Secondary | ICD-10-CM | POA: Diagnosis not present

## 2018-12-25 DIAGNOSIS — Z20828 Contact with and (suspected) exposure to other viral communicable diseases: Secondary | ICD-10-CM

## 2018-12-25 DIAGNOSIS — K8020 Calculus of gallbladder without cholecystitis without obstruction: Secondary | ICD-10-CM | POA: Diagnosis not present

## 2018-12-25 DIAGNOSIS — T368X5A Adverse effect of other systemic antibiotics, initial encounter: Secondary | ICD-10-CM | POA: Diagnosis not present

## 2018-12-25 DIAGNOSIS — Z96653 Presence of artificial knee joint, bilateral: Secondary | ICD-10-CM | POA: Diagnosis not present

## 2018-12-25 DIAGNOSIS — R809 Proteinuria, unspecified: Secondary | ICD-10-CM | POA: Diagnosis not present

## 2018-12-25 DIAGNOSIS — L89154 Pressure ulcer of sacral region, stage 4: Secondary | ICD-10-CM | POA: Diagnosis present

## 2018-12-25 DIAGNOSIS — M25572 Pain in left ankle and joints of left foot: Secondary | ICD-10-CM | POA: Diagnosis not present

## 2018-12-25 DIAGNOSIS — R000 Tachycardia, unspecified: Secondary | ICD-10-CM | POA: Diagnosis not present

## 2018-12-25 DIAGNOSIS — M25562 Pain in left knee: Secondary | ICD-10-CM | POA: Diagnosis not present

## 2018-12-25 DIAGNOSIS — M25561 Pain in right knee: Secondary | ICD-10-CM | POA: Diagnosis not present

## 2018-12-25 DIAGNOSIS — I959 Hypotension, unspecified: Secondary | ICD-10-CM | POA: Diagnosis not present

## 2018-12-25 LAB — COMPREHENSIVE METABOLIC PANEL
ALBUMIN: 3.1 g/dL — AB (ref 3.5–5.0)
ALT: 16 U/L (ref 0–44)
AST: 14 U/L — ABNORMAL LOW (ref 15–41)
Alkaline Phosphatase: 64 U/L (ref 38–126)
Anion gap: 8 (ref 5–15)
BUN: 13 mg/dL (ref 6–20)
CHLORIDE: 101 mmol/L (ref 98–111)
CO2: 29 mmol/L (ref 22–32)
Calcium: 9 mg/dL (ref 8.9–10.3)
Creatinine, Ser: 0.63 mg/dL (ref 0.44–1.00)
GFR calc Af Amer: >60 mL/min (ref >60)
GFR calc non Af Amer: >60 mL/min (ref >60)
Glucose, Bld: 139 mg/dL — ABNORMAL HIGH (ref 70–99)
POTASSIUM: 4.2 mmol/L (ref 3.5–5.1)
Sodium: 138 mmol/L (ref 135–145)
Total Bilirubin: 0.4 mg/dL (ref 0.3–1.2)
Total Protein: 6.4 g/dL — ABNORMAL LOW (ref 6.5–8.1)

## 2018-12-25 LAB — CBC WITH DIFFERENTIAL/PLATELET
Abs Immature Granulocytes: 0.05 10*3/uL (ref 0.00–0.07)
Basophils Absolute: 0.1 10*3/uL (ref 0.0–0.1)
Basophils Relative: 1 %
Eosinophils Absolute: 0.5 10*3/uL (ref 0.0–0.5)
Eosinophils Relative: 5 %
HCT: 32.9 % — ABNORMAL LOW (ref 36.0–46.0)
Hemoglobin: 10 g/dL — ABNORMAL LOW (ref 12.0–15.0)
IMMATURE GRANULOCYTES: 1 %
LYMPHS ABS: 2.2 10*3/uL (ref 0.7–4.0)
Lymphocytes Relative: 22 %
MCH: 21.1 pg — ABNORMAL LOW (ref 26.0–34.0)
MCHC: 30.4 g/dL (ref 30.0–36.0)
MCV: 69.6 fL — ABNORMAL LOW (ref 80.0–100.0)
Monocytes Absolute: 0.9 10*3/uL (ref 0.1–1.0)
Monocytes Relative: 9 %
Neutro Abs: 6.3 10*3/uL (ref 1.7–7.7)
Neutrophils Relative %: 62 %
Platelets: 324 10*3/uL (ref 150–400)
RBC: 4.73 MIL/uL (ref 3.87–5.11)
RDW: 15.3 % (ref 11.5–15.5)
WBC: 10.1 10*3/uL (ref 4.0–10.5)
nRBC: 0 % (ref 0.0–0.2)

## 2018-12-25 LAB — HEMOGLOBIN A1C
Hgb A1c MFr Bld: 7 % — ABNORMAL HIGH (ref 4.8–5.6)
Mean Plasma Glucose: 154 mg/dL

## 2018-12-25 LAB — PHOSPHORUS: Phosphorus: 4.5 mg/dL (ref 2.5–4.6)

## 2018-12-25 LAB — MAGNESIUM: Magnesium: 1.9 mg/dL (ref 1.7–2.4)

## 2018-12-25 LAB — GLUCOSE, CAPILLARY: Glucose-Capillary: 130 mg/dL — ABNORMAL HIGH (ref 70–99)

## 2018-12-25 MED ORDER — OSELTAMIVIR PHOSPHATE 75 MG PO CAPS
75.0000 mg | ORAL_CAPSULE | Freq: Every day | ORAL | Status: DC
  Administered 2018-12-25: 75 mg via ORAL
  Filled 2018-12-25: qty 1

## 2018-12-25 MED ORDER — METHOCARBAMOL 500 MG PO TABS: 500.0000 mg | ORAL_TABLET | Freq: Four times a day (QID) | ORAL | 0 refills | Status: AC | PRN

## 2018-12-25 MED ORDER — COLLAGENASE 250 UNIT/GM EX OINT: TOPICAL_OINTMENT | Freq: Every day | CUTANEOUS | 0 refills | Status: AC

## 2018-12-25 MED ORDER — HYDROCODONE-ACETAMINOPHEN 5-325 MG PO TABS: 1.0000 | ORAL_TABLET | ORAL | 0 refills | Status: AC | PRN

## 2018-12-25 MED ORDER — ACETAMINOPHEN 325 MG PO TABS: 650.0000 mg | ORAL_TABLET | Freq: Four times a day (QID) | ORAL | 0 refills | Status: AC | PRN

## 2018-12-25 MED ORDER — ONDANSETRON HCL 4 MG PO TABS: 4.0000 mg | ORAL_TABLET | Freq: Four times a day (QID) | ORAL | 0 refills | Status: AC | PRN

## 2018-12-25 MED ORDER — OSELTAMIVIR PHOSPHATE 75 MG PO CAPS: 75.0000 mg | ORAL_CAPSULE | Freq: Every day | ORAL | 0 refills | Status: AC

## 2018-12-25 NOTE — Progress Notes (Signed)
Patient ID: Hayley Alvarado, female   DOB: 12/19/1955, 63 y.o.   MRN: [id number]   Acute Care Surgery Service Progress Note:    Chief Complaint/Subjective: No c/o  PT at [facility] doing hydrotherapy  Objective: Vital signs in last 24 hours: Temp:  [98.4 F (36.9 C)] 98.4 F (36.9 C) (02/14 1312) Pulse Rate:  [93-107] 107 (02/14 1312) Resp:  [18-19] 19 (02/14 1312) BP: (144-152)/(80) 144/80 (02/14 1312) SpO2:  [96 %] 96 % (02/14 1312) Last BM Date: 12/22/18  Intake/Output from previous day: 02/13 0701 - 02/14 0700 In: 720 [P.O.:720] Out: 300 [Urine:300] Intake/Output this shift: No intake/output data recorded.  Sacrum - open wound. No more necrotic tissue. No cellulitis.    Lab Results: CBC  Recent Labs    12/24/18 0318 12/25/18 0332  WBC 8.5 10.1  HGB 10.2* 10.0*  HCT 33.0* 32.9*  PLT 309 324   BMET Recent Labs    12/24/18 0318 12/25/18 0332  NA 139 138  K 4.2 4.2  CL 99 101  CO2 29 29  GLUCOSE 119* 139*  BUN 13 13  CREATININE 0.60 0.63  CALCIUM 9.2 9.0   LFT Hepatic Function Latest Ref Rng & Units 12/25/2018 12/24/2018 12/19/2018  Total Protein 6.5 - 8.1 g/dL 6.4(L) 6.6 7.3  Albumin 3.5 - 5.0 g/dL 3.1(L) 3.1(L) 3.5  AST 15 - 41 U/L 14(L) 14(L) 27  ALT 0 - 44 U/L '16 17 10  ' Alk Phosphatase 38 - 126 U/L 64 65 77  Total Bilirubin 0.3 - 1.2 mg/dL 0.4 0.5 1.3(H)  Bilirubin, Direct 0.0 - 0.2 mg/dL - - 0.5(H)   PT/INR No results for input(s): LABPROT, INR in the last 72 hours. ABG No results for input(s): PHART, HCO3 in the last 72 hours.  Invalid input(s): PCO2, PO2  Studies/Results:  Anti-infectives: Anti-infectives (From admission, onward)   Start     Dose/Rate Route Frequency Ordered Stop   12/25/18 1000  oseltamivir (TAMIFLU) capsule 75 mg     75 mg Oral Daily 12/25/18 0831 01/01/19 0959   12/25/18 0000  oseltamivir (TAMIFLU) 75 MG capsule     75 mg Oral Daily 12/25/18 1138        Medications: Scheduled Meds: . collagenase   Topical Daily  .  enoxaparin (LOVENOX) injection  40 mg Subcutaneous Q24H  . ferrous sulfate  325 mg Oral BID WC  . fluticasone  2 spray Each Nare Daily  . gabapentin  400 mg Oral QID  . hydrochlorothiazide  25 mg Oral Daily  . insulin aspart  0-9 Units Subcutaneous TID WC  . oseltamivir  75 mg Oral Daily  . potassium chloride SA  20 mEq Oral Daily  . senna-docusate  1 tablet Oral BID   Continuous Infusions: PRN Meds:.acetaminophen **OR** acetaminophen, HYDROcodone-acetaminophen, HYDROmorphone (DILAUDID) injection, methocarbamol, ondansetron **OR** ondansetron (ZOFRAN) IV  Assessment/Plan: Patient Active Problem List   Diagnosis Date Noted  . Obesity, Class III, BMI 40-49.9 (morbid obesity) ([place]) 12/23/2018  . Hyperbilirubinemia 12/23/2018  . Leukocytosis 12/23/2018  . Pressure injury of skin 12/21/2018  . Hypokalemia 09/08/2018  . Osteoarthritis of hips, bilateral 09/08/2018  . Hypertensive urgency 09/08/2018  . Essential hypertension 02/03/2017  . Bilateral hip pain 02/03/2017  . Elevated hemoglobin A1c 02/03/2017  . Hx of iron deficiency anemia 02/03/2017  . Lymphedema 02/03/2017  . Microcytic hypochromic anemia 09/18/2015  . Body mass index 40.0-44.9, adult ([place]) 04/06/2015  . Type 2 diabetes mellitus, uncontrolled ([place]) 04/06/2015  . Artificial knee joint present, bilateral 09/19/2014  .  Chronic post-traumatic stress disorder 09/19/2014  . Lymphedema of both lower extremities 09/19/2014   DM HTN BLE lymphedema Severe bilateral hip osteoarthritis, nonambulatory  Coccyx wound - Cleaning up well with hydrotherapy and BID wet to dry dressing changes with santyl. No plans for surgical intervention. Continue current plan of care.  ID -none VTE -lovenox FEN -CM diet Foley -wick Follow up -cont current wound care. Wound looks good. [place] for [employer]. Doesn't need follow up with [place].  Can follow up in wound clinic if needed    LOS: 3 days    Leighton Ruff. Redmond Pulling, MD, FACS General, Bariatric,  & Minimally Invasive Surgery [phone] [facility]

## 2018-12-25 NOTE — Plan of Care (Signed)
  Problem: Education: Goal: Knowledge of General Education information will improve Description Including pain rating scale, medication(s)/side effects and non-pharmacologic comfort measures Outcome: Progressing   Problem: Clinical Measurements: Goal: Ability to maintain clinical measurements within normal limits will improve Outcome: Progressing   Problem: Clinical Measurements: Goal: Will remain free from infection Outcome: Progressing   

## 2018-12-25 NOTE — Discharge Summary (Addendum)
Physician Discharge Summary  Hayley Alvarado IOE:[id number] DOB: 10-30-1956 DOA: 12/19/2018  PCP: Azzie Glatter, FNP  Admit date: 12/19/2018 Discharge date: 12/25/2018  Admitted From: Home Disposition: SNF  Recommendations for Outpatient Follow-up:  1. Follow up with PCP in 1-2 weeks 2. Follow up with General Surgery in 1-2 weeks 3. Resume Hydrotherapy if necessary 4. Will need to go to Lymphedema Clinic  5. Please obtain CMP/CBC, Mag, Phos in one week 6. Please follow up on the following pending results:  Home Health: No Equipment/Devices: None    Discharge Condition: Stable CODE STATUS: FULL CODE Diet recommendation: Heart Healthy Carb Modified Diet  Brief/Interim Summary: HPI per Dr. Jennette Kettle on 12/19/2018  Hayley Alvarado a 63 y.o.femalewith medical history significant ofHTN, DM2, Lymphedema of BLE. Patient presents to ED with worsening B hip pain for past several months. She has known severe arthritis of both hips. Needs hip replacement, but ortho surgery feels that she is not a candidate due to BMI >40 and lymphedema.  She has progressed from walking with cane, to walker, to wheelchair, to non-ambulatory at home over the past several weeks.  Report of Tm 102 yesterday.  No SOB.  **She was  admitted for ambulatory dysfunction. Found to have sacral ulcer/Coccyx wound. Surgery consulted and starting hydrotherapy.  Still complains of some pain and it was a 7/10 yesterday and it was better today.  PT/OT evaluated and recommending skilled nursing facility placement next week done on 12/25/2018. Because of her neighbors in the hospital having the Influenza there was concern for a Nosocomial spread so she will be placed on Prophylaxis with Tamiflu 75 mg Daily x 7 days. She was deemed medically stable to D/C to SNF today.  Discharge Diagnoses:  Principal Problem:   Osteoarthritis of hips, bilateral Active Problems:   Essential hypertension   Bilateral hip pain   Hx  of iron deficiency anemia   Lymphedema   Body mass index 40.0-44.9, adult (HCC)   Lymphedema of both lower extremities   Microcytic hypochromic anemia   Type 2 diabetes mellitus, uncontrolled (HCC)   Pressure injury of skin   Obesity, Class III, BMI 40-49.9 (morbid obesity) (HCC)   Hyperbilirubinemia   Leukocytosis  Ambulatory Dysfunction and Deconditioning secondary to Severe Osteoarthritis of hips  -Patient states she is not able to walk or move -Continue Pain Control with po Acetaminophen 650 mg po q6hprn Mild Pain, Hydrocodone-Acetaminophen 1 tab po q6hprn Moderate Pain/Severe Pain changed to q4hprn, and IV Hydromorphone 1 mg q4hprn Severe Pain -C/w Ketorolac 30 mg IV q6hprn Moderate Pain for now -Bowel Regimen with Senna-Docusate 1 tab po BID -C/w Gabapentin 400 mg po 4 times Daily  -PT and OT recommended SNF  -Social work and case management- pending placement and will go to [facility] in [place] on 12/25/2018  RLE Pressure Ulcer/Sacral Decubitus Coccyx wound -POA, on Right thigh, with Eschar -POA, sacral decub,Unstageable, but full thickness. Will be a Stage 3 or 4 Pressure injury -Wound care consulted -General surgery consulted and appreciated, recommended hydrotherapy; General Surgery feels wound is cleaning up well with Hydrotherapy and recommending BID wet to dry Dressing changes with Collagenase; No plans for Surgical Intervention currently -Will need to continue Hydrotherapy as an outpatient  -Continue pain control as above -Added on muscle relaxantand changed IV Robaxin to po   Lymphadema of B/L LE -Chronic -Stable. Will need referral to Lymphedema Clinic at D/C and states they can do it at the [facility] in [place] Hypertension -BP  was slightly elevated this AM and was 152/80 2/2 to Pain -Continue HCTZ 25 mg po daily   Diabetes Mellitus, Type 2 -Discontinued Metformin and was placed on Sensitive Novolog SSI AC -Last HbA1c was 7.0 on 09/09/2018  so will repeat now -CBG's ranging from 127-145  Morbid Obesity -Estimated body mass index is 43.94 kg/m as calculated from the following:   Height as of this encounter: 5\' 4"  (1.626 m).   Weight as of this encounter: 116.1 kg. -Weight Loss and Dietary Counseling given   Microcytic Anemia -Hb/Hct this Am was 10.2/33.0 and on Admission was 10.0/32.9; MCV low at 70.2 today  -[place] Anemia Panel and showed a level of 38, U IBC of 193, TIBC of 231, saturation ratios of 16%, ferritin level of 249, folate level 11.7, and vitamin B12 level 440 -C/w Ferrous Sulfate 325 mg po BID for now  -Continue to Monitor for S/Sx of Bleeding -Repeat CBC in AM   Leukocytosis  -Improved. WBC went from 12.3 -> 8.7 -> 8.5 -> 10.1 -Continue to Monitor for S/Sx of Infection -Repeat CBC in AM   Hyperbilirubinemia -Mild and likely reactive at 1.3 on admission; now improved and is 0.4 -We will not check CMP's anymore given that it is trended down  ? Flu Exposure -There was concern for Nosocomial Spread as Patient's Neighbors in the hospital have the Influenza -Discussed with ID Dr. Carlyle Basques and she recommends Prophylactic Dose of Tamiflu 75 mg Daily x 7 Days  Discharge Instructions Discharge Instructions    Call MD for:  difficulty breathing, headache or visual disturbances   Complete by:  As directed    Call MD for:  extreme fatigue   Complete by:  As directed    Call MD for:  hives   Complete by:  As directed    Call MD for:  persistant dizziness or light-headedness   Complete by:  As directed    Call MD for:  persistant nausea and vomiting   Complete by:  As directed    Call MD for:  redness, tenderness, or signs of infection (pain, swelling, redness, odor or green/yellow discharge around incision site)   Complete by:  As directed    Call MD for:  severe uncontrolled pain   Complete by:  As directed    Call MD for:  temperature >100.4   Complete by:  As directed    Diet - low sodium  heart healthy   Complete by:  As directed    Diet Carb Modified   Complete by:  As directed    Discharge instructions   Complete by:  As directed    You were cared for by a hospitalist during your hospital stay. If you have any questions about your discharge medications or the care you received while you were in the hospital after you are discharged, you can call the unit and ask to speak with the hospitalist on call if the hospitalist that took care of you is not available. Once you are discharged, your primary care physician will handle any further medical issues. Please note that NO REFILLS for any discharge medications will be authorized once you are discharged, as it is imperative that you return to your primary care physician (or establish a relationship with a primary care physician if you do not have one) for your aftercare needs so that they can reassess your need for medications and monitor your lab values.  Follow up with PCP and General Surgery if necessary. Take  all medications as prescribed. If symptoms change or worsen please return to the ED for evaluation   Increase activity slowly   Complete by:  As directed      Allergies as of 12/25/2018      Reactions   Lisinopril Cough   Morphine And Related Other (See Comments)   flushing   Verapamil Other (See Comments)   Morphine Other (See Comments)   flushing      Medication List    TAKE these medications   acetaminophen 325 MG tablet Commonly known as:  TYLENOL Take 2 tablets (650 mg total) by mouth every 6 (six) hours as needed for mild pain (or Fever >/= 101).   collagenase ointment Commonly known as:  SANTYL Apply topically daily.   ferrous sulfate 325 (65 FE) MG tablet Take 325 mg by mouth 2 (two) times daily with a meal.   fluticasone 50 MCG/ACT nasal spray Commonly known as:  FLONASE Place 2 sprays into both nostrils daily.   gabapentin 400 MG capsule Commonly known as:  NEURONTIN TAKE ONE CAPSULE BY MOUTH 4  TIMES A DAY What changed:    how much to take  how to take this  when to take this  additional instructions   hydrochlorothiazide 25 MG tablet Commonly known as:  HYDRODIURIL Take 1 tablet (25 mg total) by mouth daily.   HYDROcodone-acetaminophen 5-325 MG tablet Commonly known as:  NORCO/VICODIN Take 1 tablet by mouth every 4 (four) hours as needed for moderate pain or severe pain.   meloxicam 15 MG tablet Commonly known as:  MOBIC Take 1 tablet (15 mg total) by mouth daily.   metFORMIN 500 MG tablet Commonly known as:  GLUCOPHAGE Take 1 tablet (500 mg total) by mouth 2 (two) times daily with a meal.   methocarbamol 500 MG tablet Commonly known as:  ROBAXIN Take 1 tablet (500 mg total) by mouth every 6 (six) hours as needed for muscle spasms.   ondansetron 4 MG tablet Commonly known as:  ZOFRAN Take 1 tablet (4 mg total) by mouth every 6 (six) hours as needed for nausea.   oseltamivir 75 MG capsule Commonly known as:  TAMIFLU Take 1 capsule (75 mg total) by mouth daily.   potassium chloride SA 20 MEQ tablet Commonly known as:  K-DUR,KLOR-CON Take 1 tablet (20 mEq total) by mouth daily.   senna-docusate 8.6-50 MG tablet Commonly known as:  SENOKOT S Take 1 tablet by mouth 2 (two) times daily.   triamcinolone cream 0.1 % Commonly known as:  KENALOG Apply 1 application topically 2 (two) times daily.       Allergies  Allergen Reactions  . Lisinopril Cough  . Morphine And Related Other (See Comments)    flushing  . Verapamil Other (See Comments)  . Morphine Other (See Comments)    flushing   Consultations:  None  Procedures/Studies: Dg Chest 1 View  Result Date: 12/19/2018 CLINICAL DATA:  Bilateral hip pain EXAM: CHEST  1 VIEW COMPARISON:  None. FINDINGS: Heart and mediastinal contours are within normal limits. No focal opacities or effusions. No acute bony abnormality. IMPRESSION: No active disease. Electronically Signed   By: Rolm Baptise M.D.   On:  12/19/2018 22:35   Dg Hips Bilat W Or Wo Pelvis 3-4 Views  Result Date: 12/19/2018 CLINICAL DATA:  Chronic bilateral hip pain. EXAM: DG HIP (WITH OR WITHOUT PELVIS) 3-4V BILAT COMPARISON:  None. FINDINGS: Severe osteoarthritis within the hips bilaterally with joint space loss, osteophyte formation, subchondral  sclerosis and cyst formation. SI joints symmetric and unremarkable. No acute bony abnormality. Specifically, no fracture, subluxation, or dislocation. IMPRESSION: Severe bilateral osteoarthritis.  No acute bony abnormality. Electronically Signed   By: Rolm Baptise M.D.   On: 12/19/2018 22:36    Subjective: Seen and examined at bedside and is doing better.  Denies any chest pain, lightheadedness or dizziness.  No nausea or vomiting.  States the pain is little bit better today but is increasing her hydrocodone.  No other concerns or complaints at this time.  Discharge Exam: Vitals:   12/24/18 1330 12/24/18 2043  BP: 128/64 (!) 152/80  Pulse: 88 93  Resp: 14 18  Temp: 98.4 F (36.9 C) 98.4 F (36.9 C)  SpO2: 98% 96%   Vitals:   12/24/18 0423 12/24/18 0846 12/24/18 1330 12/24/18 2043  BP: 135/76 (!) 150/77 128/64 (!) 152/80  Pulse: 80 96 88 93  Resp:  16 14 18   Temp: 98 F (36.7 C) 98.2 F (36.8 C) 98.4 F (36.9 C) 98.4 F (36.9 C)  TempSrc: Oral Oral Oral Oral  SpO2: 99% 96% 98% 96%  Weight:      Height:       General: Pt is alert, awake, not in acute distress Cardiovascular: RRR, S1/S2 +, no rubs, no gallops Respiratory: Diminished bilaterally, no wheezing, no rhonchi Abdominal: Soft, NT, Distended due to body habitus, bowel sounds + Extremities: Bilateral LE lymphedema, no cyanosis; Has a wound on Right Thigh and a Coccyx Ulcer  The results of significant diagnostics from this hospitalization (including imaging, microbiology, ancillary and laboratory) are listed below for reference.    Microbiology: No results found for this or any previous visit (from the past 240  hour(s)).   Labs: BNP (last 3 results) No results for input(s): BNP in the last 8760 hours. Basic Metabolic Panel: Recent Labs  Lab 12/19/18 1900 12/23/18 0212 12/24/18 0318 12/25/18 0332  NA 138 138 139 138  K 4.8 4.1 4.2 4.2  CL 97* 98 99 101  CO2 27 28 29 29   GLUCOSE 119* 140* 119* 139*  BUN 23* 14 13 13   CREATININE 0.77 0.64 0.60 0.63  CALCIUM 9.1 8.9 9.2 9.0  MG  --   --  2.0 1.9  PHOS  --   --  4.4 4.5   Liver Function Tests: Recent Labs  Lab 12/19/18 1900 12/24/18 0318 12/25/18 0332  AST 27 14* 14*  ALT 10 17 16   ALKPHOS 77 65 64  BILITOT 1.3* 0.5 0.4  PROT 7.3 6.6 6.4*  ALBUMIN 3.5 3.1* 3.1*   No results for input(s): LIPASE, AMYLASE in the last 168 hours. No results for input(s): AMMONIA in the last 168 hours. CBC: Recent Labs  Lab 12/19/18 1900 12/23/18 0212 12/24/18 0318 12/25/18 0332  WBC 12.3* 8.7 8.5 10.1  NEUTROABS 9.3*  --  4.9 6.3  HGB 10.3* 10.1* 10.2* 10.0*  HCT 34.5* 32.8* 33.0* 32.9*  MCV 70.8* 70.4* 70.2* 69.6*  PLT 198 271 309 324   Cardiac Enzymes: No results for input(s): CKTOTAL, CKMB, CKMBINDEX, TROPONINI in the last 168 hours. BNP: Invalid input(s): POCBNP CBG: Recent Labs  Lab 12/24/18 0629 12/24/18 1119 12/24/18 1544 12/24/18 2225 12/25/18 0601  GLUCAP 110* 134* 145* 127* 130*   D-Dimer No results for input(s): DDIMER in the last 72 hours. Hgb A1c Recent Labs    12/24/18 0318  HGBA1C 7.0*   Lipid Profile No results for input(s): CHOL, HDL, LDLCALC, TRIG, CHOLHDL, LDLDIRECT in the last 72  hours. Thyroid function studies No results for input(s): TSH, T4TOTAL, T3FREE, THYROIDAB in the last 72 hours.  Invalid input(s): FREET3 Anemia work up Recent Labs    12/24/18 0318  VITAMINB12 440  FOLATE 11.7  FERRITIN 249  TIBC 231*  IRON 38  RETICCTPCT 1.5   Urinalysis    Component Value Date/Time   COLORURINE YELLOW 12/19/2018 2208   APPEARANCEUR HAZY (A) 12/19/2018 2208   LABSPEC 1.024 12/19/2018 2208    PHURINE 5.0 12/19/2018 2208   GLUCOSEU NEGATIVE 12/19/2018 2208   HGBUR SMALL (A) 12/19/2018 2208   BILIRUBINUR NEGATIVE 12/19/2018 2208   BILIRUBINUR negative 04/15/2018 1520   KETONESUR NEGATIVE 12/19/2018 2208   PROTEINUR NEGATIVE 12/19/2018 2208   UROBILINOGEN 0.2 04/15/2018 1520   NITRITE NEGATIVE 12/19/2018 2208   LEUKOCYTESUR SMALL (A) 12/19/2018 2208   Sepsis Labs Invalid input(s): PROCALCITONIN,  WBC,  LACTICIDVEN Microbiology No results found for this or any previous visit (from the past 240 hour(s)).  Time coordinating discharge: 35 minutes  SIGNED:  Kerney Elbe, DO [facility] 12/25/2018, 11:39 AM Pager is on [address]  If 7PM-7AM, please contact night-coverage [email or web address] Password [email or web address]

## 2018-12-25 NOTE — Progress Notes (Signed)
Pt alert, able to make needs known. C/o pain, medicated as needed. No acute distress noted. EMS arrived to take pt to SNF.  Report called to [facility], [place].

## 2018-12-25 NOTE — Clinical Social Work Placement (Signed)
   CLINICAL SOCIAL WORK PLACEMENT  NOTE  Date:  12/25/2018  Patient Details  Name: Hayley Alvarado MRN: [id number] Date of Birth: 1956-05-12  Clinical Social Work is seeking post-discharge placement for this patient at the [place] level of care (*CSW will initial, date and re-position this form in  chart as items are completed):      Patient/family provided with [place] Work Department's list of facilities offering this level of care within the geographic area requested by the patient (or if unable, by the patient's family).  Yes   Patient/family informed of their freedom to choose among providers that offer the needed level of care, that participate in Medicare, Medicaid or managed care program needed by the patient, have an available bed and are willing to accept the patient.      Patient/family informed of [place]'s ownership interest in [facility] and [facility], as well as of the fact that they are under no obligation to receive care at these facilities.  PASRR submitted to EDS on       PASRR number received on 12/20/18     Existing PASRR number confirmed on       FL2 transmitted to all facilities in geographic area requested by pt/family on 12/20/18     FL2 transmitted to all facilities within larger geographic area on       Patient informed that his/her managed care company has contracts with or will negotiate with certain facilities, including the following:        Yes   Patient/family informed of bed offers received.  Patient chooses bed at [facility]     Physician recommends and patient chooses bed at      Patient to be transferred to [facility] on 12/25/18.  Patient to be transferred to facility by [facility]     Patient family notified on 12/25/18 of transfer.  Name of family member notified:  [name]     PHYSICIAN       Additional Comment:     _______________________________________________ Alberteen Sam, LCSW 12/25/2018, 12:28 PM

## 2018-12-25 NOTE — Progress Notes (Signed)
Physical Therapy Wound Treatment Patient Details  Name: Hayley Alvarado MRN: [id number] Date of Birth: 01-Jul-1956  Today's Date: 12/25/2018 Time:  -     Subjective  Subjective: "I smelled it for this first time today." Pt referring to wound Patient and Family Stated Goals: To heal wound Date of Onset: (unknown) Prior Treatments: none  Pain Score:  Pt with pain with positioning and during selective debridement; Pre medicated  Wound Assessment  Pressure Injury 12/20/18 Unstageable - Full thickness tissue loss in which the base of the ulcer is covered by slough (yellow, tan, gray, green or brown) and/or eschar (tan, brown or black) in the wound bed. (Active)  Dressing Type Foam;Gauze (Comment) 12/25/2018 10:04 AM  Dressing Clean;Dry;Intact 12/25/2018 10:04 AM  Dressing Change Frequency Twice a day 12/25/2018 10:04 AM  State of Healing Early/partial granulation 12/25/2018 10:04 AM  Site / Wound Assessment Brown;Red;Yellow 12/25/2018 10:04 AM  % Wound base Red or Granulating 70% 12/25/2018 10:04 AM  % Wound base Yellow/Fibrinous Exudate 20% 12/25/2018 10:04 AM  % Wound base Black/Eschar 10% 12/25/2018 10:04 AM  % Wound base Other/Granulation Tissue (Comment) 0% 12/25/2018 10:04 AM  Peri-wound Assessment Maceration 12/25/2018 10:04 AM  Wound Length (cm) 3.2 cm 12/22/2018 11:22 AM  Wound Width (cm) 3 cm 12/22/2018 11:22 AM  Wound Depth (cm) 2 cm 12/22/2018 11:22 AM  Wound Surface Area (cm^2) 9.6 cm^2 12/22/2018 11:22 AM  Wound Volume (cm^3) 19.2 cm^3 12/22/2018 11:22 AM  Tunneling (cm) 0 12/22/2018 11:22 AM  Undermining (cm) 0 12/22/2018 11:22 AM  Margins Unattached edges (unapproximated) 12/25/2018 10:04 AM  Drainage Amount Minimal 12/25/2018 10:04 AM  Drainage Description Serosanguineous;Odor 12/25/2018 10:04 AM  Treatment Debridement (Selective);Hydrotherapy (Pulse lavage) 12/25/2018 10:04 AM      Hydrotherapy Pulsed lavage therapy - wound location: sacrum Pulsed Lavage with Suction (psi): 12  psi Pulsed Lavage with Suction - Normal Saline Used: 1000 mL Pulsed Lavage Tip: Tip with splash shield Selective Debridement Selective Debridement - Location: sacrum Selective Debridement - Tools Used: Forceps;Scissors Selective Debridement - Tissue Removed: slough   Wound Assessment and Plan  Wound Therapy - Assess/Plan/Recommendations Wound Therapy - Clinical Statement: Continues with increased red/granulation wound bed and able to remove minimal slough/eschar. Limited debridement today due to pt pain. Pt will benefit from continued hydrotherapy to cleanse wound and promote healing. Wound Therapy - Functional Problem List: open wound; immobility Factors Delaying/Impairing Wound Healing: Diabetes Mellitus;Immobility Hydrotherapy Plan: Debridement;Pulsatile lavage with suction;Patient/family education;Dressing change Wound Therapy - Frequency: 6X / week Wound Therapy - Follow Up Recommendations: Skilled nursing facility Wound Plan: see above  Wound Therapy Goals- Improve the function of patient's integumentary system by progressing the wound(s) through the phases of wound healing (inflammation - proliferation - remodeling) by: Decrease Necrotic Tissue to: 20 Decrease Necrotic Tissue - Progress: Progressing toward goal Increase Granulation Tissue to: 80 Increase Granulation Tissue - Progress: Progressing toward goal Decrease Length/Width/Depth by (cm): Depth by 0.5 cm Decrease Length/Width/Depth - Progress: Progressing toward goal Goals/treatment plan/discharge plan were made with and agreed upon by patient/family: Yes Time For Goal Achievement: 2 weeks Wound Therapy - Potential for Goals: Good  Goals will be updated until maximal potential achieved or discharge criteria met.  Discharge criteria: when goals achieved, discharge from hospital, MD decision/surgical intervention, no progress towards goals, refusal/missing three consecutive treatments without notification or medical  reason.  GP    Ellamae Sia, PT, DPT Acute Rehabilitation Services Pager [phone] Office [phone]   Willy Eddy 12/25/2018, 10:06 AM

## 2018-12-25 NOTE — Telephone Encounter (Signed)
Patient was advise about a advance directive to see if the nurse or social worker can provider her with the paperwork in the hospital.

## 2018-12-25 NOTE — Progress Notes (Signed)
Patient will DC [facility] Anticipated DC date:12/25/2018 Family notified:[name]  Per MD patient ready for DC to [place], patient, patient's family, and facility notified of DC. Discharge Summary sent to facility. RN given number for report [phone] Room 104. DC packet on chart. Ambulance transport requested for patient.  CSW signing off.  [place], [place]

## 2018-12-28 DIAGNOSIS — R5381 Other malaise: Secondary | ICD-10-CM | POA: Diagnosis not present

## 2018-12-29 DIAGNOSIS — R5381 Other malaise: Secondary | ICD-10-CM | POA: Diagnosis not present

## 2018-12-30 DIAGNOSIS — R5381 Other malaise: Secondary | ICD-10-CM | POA: Diagnosis not present

## 2018-12-31 DIAGNOSIS — L22 Diaper dermatitis: Secondary | ICD-10-CM | POA: Diagnosis not present

## 2018-12-31 DIAGNOSIS — I83012 Varicose veins of right lower extremity with ulcer of calf: Secondary | ICD-10-CM | POA: Diagnosis not present

## 2018-12-31 DIAGNOSIS — R5381 Other malaise: Secondary | ICD-10-CM | POA: Diagnosis not present

## 2019-01-01 ENCOUNTER — Ambulatory Visit: Payer: Medicare Other | Admitting: Family Medicine

## 2019-01-01 DIAGNOSIS — R5381 Other malaise: Secondary | ICD-10-CM | POA: Diagnosis not present

## 2019-01-05 DIAGNOSIS — R5381 Other malaise: Secondary | ICD-10-CM | POA: Diagnosis not present

## 2019-01-06 DIAGNOSIS — M160 Bilateral primary osteoarthritis of hip: Secondary | ICD-10-CM | POA: Diagnosis not present

## 2019-01-07 DIAGNOSIS — R5381 Other malaise: Secondary | ICD-10-CM | POA: Diagnosis not present

## 2019-01-08 DIAGNOSIS — R5381 Other malaise: Secondary | ICD-10-CM | POA: Diagnosis not present

## 2019-01-12 DIAGNOSIS — R5381 Other malaise: Secondary | ICD-10-CM | POA: Diagnosis not present

## 2019-01-13 DIAGNOSIS — M25551 Pain in right hip: Secondary | ICD-10-CM | POA: Diagnosis not present

## 2019-01-13 DIAGNOSIS — M25552 Pain in left hip: Secondary | ICD-10-CM | POA: Diagnosis not present

## 2019-01-13 DIAGNOSIS — R5381 Other malaise: Secondary | ICD-10-CM | POA: Diagnosis not present

## 2019-01-14 DIAGNOSIS — R5381 Other malaise: Secondary | ICD-10-CM | POA: Diagnosis not present

## 2019-01-18 DIAGNOSIS — R5381 Other malaise: Secondary | ICD-10-CM | POA: Diagnosis not present

## 2019-01-20 DIAGNOSIS — R5381 Other malaise: Secondary | ICD-10-CM | POA: Diagnosis not present

## 2019-01-26 DIAGNOSIS — R5381 Other malaise: Secondary | ICD-10-CM | POA: Diagnosis not present

## 2019-01-27 DIAGNOSIS — T7621XA Adult sexual abuse, suspected, initial encounter: Secondary | ICD-10-CM | POA: Diagnosis not present

## 2019-01-27 DIAGNOSIS — D259 Leiomyoma of uterus, unspecified: Secondary | ICD-10-CM | POA: Diagnosis not present

## 2019-01-27 DIAGNOSIS — M4628 Osteomyelitis of vertebra, sacral and sacrococcygeal region: Secondary | ICD-10-CM | POA: Diagnosis present

## 2019-01-27 DIAGNOSIS — T797XXA Traumatic subcutaneous emphysema, initial encounter: Secondary | ICD-10-CM | POA: Diagnosis not present

## 2019-01-27 DIAGNOSIS — R58 Hemorrhage, not elsewhere classified: Secondary | ICD-10-CM | POA: Diagnosis not present

## 2019-01-27 DIAGNOSIS — H5712 Ocular pain, left eye: Secondary | ICD-10-CM | POA: Diagnosis not present

## 2019-01-27 DIAGNOSIS — M25562 Pain in left knee: Secondary | ICD-10-CM | POA: Diagnosis not present

## 2019-01-27 DIAGNOSIS — N2589 Other disorders resulting from impaired renal tubular function: Secondary | ICD-10-CM | POA: Diagnosis not present

## 2019-01-27 DIAGNOSIS — R918 Other nonspecific abnormal finding of lung field: Secondary | ICD-10-CM | POA: Diagnosis not present

## 2019-01-27 DIAGNOSIS — Z792 Long term (current) use of antibiotics: Secondary | ICD-10-CM | POA: Diagnosis not present

## 2019-01-27 DIAGNOSIS — Z471 Aftercare following joint replacement surgery: Secondary | ICD-10-CM | POA: Diagnosis not present

## 2019-01-27 DIAGNOSIS — R05 Cough: Secondary | ICD-10-CM | POA: Diagnosis not present

## 2019-01-27 DIAGNOSIS — N840 Polyp of corpus uteri: Secondary | ICD-10-CM | POA: Diagnosis not present

## 2019-01-27 DIAGNOSIS — K8020 Calculus of gallbladder without cholecystitis without obstruction: Secondary | ICD-10-CM | POA: Diagnosis not present

## 2019-01-27 DIAGNOSIS — R251 Tremor, unspecified: Secondary | ICD-10-CM | POA: Diagnosis not present

## 2019-01-27 DIAGNOSIS — I10 Essential (primary) hypertension: Secondary | ICD-10-CM | POA: Diagnosis not present

## 2019-01-27 DIAGNOSIS — N950 Postmenopausal bleeding: Secondary | ICD-10-CM | POA: Diagnosis not present

## 2019-01-27 DIAGNOSIS — F419 Anxiety disorder, unspecified: Secondary | ICD-10-CM | POA: Diagnosis not present

## 2019-01-27 DIAGNOSIS — Z30430 Encounter for insertion of intrauterine contraceptive device: Secondary | ICD-10-CM | POA: Diagnosis not present

## 2019-01-27 DIAGNOSIS — R270 Ataxia, unspecified: Secondary | ICD-10-CM | POA: Diagnosis not present

## 2019-01-27 DIAGNOSIS — G5622 Lesion of ulnar nerve, left upper limb: Secondary | ICD-10-CM | POA: Diagnosis not present

## 2019-01-27 DIAGNOSIS — M25559 Pain in unspecified hip: Secondary | ICD-10-CM | POA: Diagnosis not present

## 2019-01-27 DIAGNOSIS — N170 Acute kidney failure with tubular necrosis: Secondary | ICD-10-CM | POA: Diagnosis not present

## 2019-01-27 DIAGNOSIS — R5383 Other fatigue: Secondary | ICD-10-CM | POA: Diagnosis present

## 2019-01-27 DIAGNOSIS — R0603 Acute respiratory distress: Secondary | ICD-10-CM | POA: Diagnosis not present

## 2019-01-27 DIAGNOSIS — R824 Acetonuria: Secondary | ICD-10-CM | POA: Diagnosis not present

## 2019-01-27 DIAGNOSIS — Z9181 History of falling: Secondary | ICD-10-CM | POA: Diagnosis not present

## 2019-01-27 DIAGNOSIS — E1165 Type 2 diabetes mellitus with hyperglycemia: Secondary | ICD-10-CM | POA: Diagnosis not present

## 2019-01-27 DIAGNOSIS — B9689 Other specified bacterial agents as the cause of diseases classified elsewhere: Secondary | ICD-10-CM | POA: Diagnosis not present

## 2019-01-27 DIAGNOSIS — E1169 Type 2 diabetes mellitus with other specified complication: Secondary | ICD-10-CM | POA: Diagnosis present

## 2019-01-27 DIAGNOSIS — F4312 Post-traumatic stress disorder, chronic: Secondary | ICD-10-CM | POA: Diagnosis not present

## 2019-01-27 DIAGNOSIS — E878 Other disorders of electrolyte and fluid balance, not elsewhere classified: Secondary | ICD-10-CM | POA: Diagnosis not present

## 2019-01-27 DIAGNOSIS — S31001A Unspecified open wound of lower back and pelvis with penetration into retroperitoneum, initial encounter: Secondary | ICD-10-CM | POA: Diagnosis not present

## 2019-01-27 DIAGNOSIS — L89159 Pressure ulcer of sacral region, unspecified stage: Secondary | ICD-10-CM | POA: Diagnosis not present

## 2019-01-27 DIAGNOSIS — E876 Hypokalemia: Secondary | ICD-10-CM | POA: Diagnosis not present

## 2019-01-27 DIAGNOSIS — T368X5A Adverse effect of other systemic antibiotics, initial encounter: Secondary | ICD-10-CM | POA: Diagnosis not present

## 2019-01-27 DIAGNOSIS — N179 Acute kidney failure, unspecified: Secondary | ICD-10-CM | POA: Diagnosis not present

## 2019-01-27 DIAGNOSIS — D250 Submucous leiomyoma of uterus: Secondary | ICD-10-CM | POA: Diagnosis present

## 2019-01-27 DIAGNOSIS — Y33XXXA Other specified events, undetermined intent, initial encounter: Secondary | ICD-10-CM | POA: Diagnosis not present

## 2019-01-27 DIAGNOSIS — M25561 Pain in right knee: Secondary | ICD-10-CM | POA: Diagnosis not present

## 2019-01-27 DIAGNOSIS — L8995 Pressure ulcer of unspecified site, unstageable: Secondary | ICD-10-CM | POA: Diagnosis not present

## 2019-01-27 DIAGNOSIS — Z751 Person awaiting admission to adequate facility elsewhere: Secondary | ICD-10-CM | POA: Diagnosis not present

## 2019-01-27 DIAGNOSIS — G43909 Migraine, unspecified, not intractable, without status migrainosus: Secondary | ICD-10-CM | POA: Diagnosis not present

## 2019-01-27 DIAGNOSIS — Z79899 Other long term (current) drug therapy: Secondary | ICD-10-CM | POA: Diagnosis not present

## 2019-01-27 DIAGNOSIS — N939 Abnormal uterine and vaginal bleeding, unspecified: Secondary | ICD-10-CM | POA: Diagnosis not present

## 2019-01-27 DIAGNOSIS — D638 Anemia in other chronic diseases classified elsewhere: Secondary | ICD-10-CM | POA: Diagnosis present

## 2019-01-27 DIAGNOSIS — A1801 Tuberculosis of spine: Secondary | ICD-10-CM | POA: Diagnosis not present

## 2019-01-27 DIAGNOSIS — A419 Sepsis, unspecified organism: Secondary | ICD-10-CM | POA: Diagnosis not present

## 2019-01-27 DIAGNOSIS — D509 Iron deficiency anemia, unspecified: Secondary | ICD-10-CM | POA: Diagnosis not present

## 2019-01-27 DIAGNOSIS — R5381 Other malaise: Secondary | ICD-10-CM | POA: Diagnosis not present

## 2019-01-27 DIAGNOSIS — E872 Acidosis: Secondary | ICD-10-CM | POA: Diagnosis not present

## 2019-01-27 DIAGNOSIS — Z7401 Bed confinement status: Secondary | ICD-10-CM | POA: Diagnosis not present

## 2019-01-27 DIAGNOSIS — R809 Proteinuria, unspecified: Secondary | ICD-10-CM | POA: Diagnosis not present

## 2019-01-27 DIAGNOSIS — Z09 Encounter for follow-up examination after completed treatment for conditions other than malignant neoplasm: Secondary | ICD-10-CM | POA: Diagnosis not present

## 2019-01-27 DIAGNOSIS — S31000A Unspecified open wound of lower back and pelvis without penetration into retroperitoneum, initial encounter: Secondary | ICD-10-CM | POA: Diagnosis not present

## 2019-01-27 DIAGNOSIS — M869 Osteomyelitis, unspecified: Secondary | ICD-10-CM | POA: Diagnosis not present

## 2019-01-27 DIAGNOSIS — M160 Bilateral primary osteoarthritis of hip: Secondary | ICD-10-CM | POA: Diagnosis not present

## 2019-01-27 DIAGNOSIS — L98429 Non-pressure chronic ulcer of back with unspecified severity: Secondary | ICD-10-CM | POA: Diagnosis not present

## 2019-01-27 DIAGNOSIS — S31000S Unspecified open wound of lower back and pelvis without penetration into retroperitoneum, sequela: Secondary | ICD-10-CM | POA: Diagnosis not present

## 2019-01-27 DIAGNOSIS — I890 Lymphedema, not elsewhere classified: Secondary | ICD-10-CM | POA: Diagnosis not present

## 2019-01-27 DIAGNOSIS — Z452 Encounter for adjustment and management of vascular access device: Secondary | ICD-10-CM | POA: Diagnosis not present

## 2019-01-27 DIAGNOSIS — M25572 Pain in left ankle and joints of left foot: Secondary | ICD-10-CM | POA: Diagnosis not present

## 2019-01-27 DIAGNOSIS — Z0441 Encounter for examination and observation following alleged adult rape: Secondary | ICD-10-CM | POA: Diagnosis not present

## 2019-01-27 DIAGNOSIS — Z6841 Body Mass Index (BMI) 40.0 and over, adult: Secondary | ICD-10-CM | POA: Diagnosis not present

## 2019-01-27 DIAGNOSIS — L89154 Pressure ulcer of sacral region, stage 4: Secondary | ICD-10-CM | POA: Diagnosis present

## 2019-01-27 DIAGNOSIS — S31000D Unspecified open wound of lower back and pelvis without penetration into retroperitoneum, subsequent encounter: Secondary | ICD-10-CM | POA: Diagnosis not present

## 2019-01-27 DIAGNOSIS — I959 Hypotension, unspecified: Secondary | ICD-10-CM | POA: Diagnosis not present

## 2019-01-27 DIAGNOSIS — Z20828 Contact with and (suspected) exposure to other viral communicable diseases: Secondary | ICD-10-CM | POA: Diagnosis present

## 2019-01-27 DIAGNOSIS — Z96652 Presence of left artificial knee joint: Secondary | ICD-10-CM | POA: Diagnosis not present

## 2019-01-27 DIAGNOSIS — R000 Tachycardia, unspecified: Secondary | ICD-10-CM | POA: Diagnosis not present

## 2019-02-04 ENCOUNTER — Other Ambulatory Visit: Payer: Self-pay | Admitting: Family Medicine

## 2019-02-04 DIAGNOSIS — M25551 Pain in right hip: Secondary | ICD-10-CM

## 2019-02-04 DIAGNOSIS — M25552 Pain in left hip: Principal | ICD-10-CM

## 2019-03-18 NOTE — Telephone Encounter (Signed)
Message sent to provider 

## 2019-03-29 DIAGNOSIS — M4628 Osteomyelitis of vertebra, sacral and sacrococcygeal region: Secondary | ICD-10-CM | POA: Diagnosis not present

## 2019-03-29 DIAGNOSIS — L89154 Pressure ulcer of sacral region, stage 4: Secondary | ICD-10-CM | POA: Diagnosis not present

## 2019-03-29 DIAGNOSIS — M160 Bilateral primary osteoarthritis of hip: Secondary | ICD-10-CM | POA: Diagnosis not present

## 2019-03-29 DIAGNOSIS — Z6841 Body Mass Index (BMI) 40.0 and over, adult: Secondary | ICD-10-CM | POA: Diagnosis not present

## 2019-03-29 DIAGNOSIS — I10 Essential (primary) hypertension: Secondary | ICD-10-CM | POA: Diagnosis not present

## 2019-03-29 DIAGNOSIS — N950 Postmenopausal bleeding: Secondary | ICD-10-CM | POA: Diagnosis not present

## 2019-03-30 DIAGNOSIS — M4628 Osteomyelitis of vertebra, sacral and sacrococcygeal region: Secondary | ICD-10-CM | POA: Diagnosis not present

## 2019-03-30 DIAGNOSIS — Z6841 Body Mass Index (BMI) 40.0 and over, adult: Secondary | ICD-10-CM | POA: Diagnosis not present

## 2019-03-30 DIAGNOSIS — M160 Bilateral primary osteoarthritis of hip: Secondary | ICD-10-CM | POA: Diagnosis not present

## 2019-03-30 DIAGNOSIS — I10 Essential (primary) hypertension: Secondary | ICD-10-CM | POA: Diagnosis not present

## 2019-03-30 DIAGNOSIS — Z30430 Encounter for insertion of intrauterine contraceptive device: Secondary | ICD-10-CM | POA: Diagnosis not present

## 2019-03-30 DIAGNOSIS — N840 Polyp of corpus uteri: Secondary | ICD-10-CM | POA: Diagnosis not present

## 2019-03-30 DIAGNOSIS — N950 Postmenopausal bleeding: Secondary | ICD-10-CM | POA: Diagnosis not present

## 2019-03-30 DIAGNOSIS — L89154 Pressure ulcer of sacral region, stage 4: Secondary | ICD-10-CM | POA: Diagnosis not present

## 2019-03-31 DIAGNOSIS — N950 Postmenopausal bleeding: Secondary | ICD-10-CM | POA: Diagnosis not present

## 2019-03-31 DIAGNOSIS — Z09 Encounter for follow-up examination after completed treatment for conditions other than malignant neoplasm: Secondary | ICD-10-CM | POA: Diagnosis not present

## 2019-03-31 DIAGNOSIS — M4628 Osteomyelitis of vertebra, sacral and sacrococcygeal region: Secondary | ICD-10-CM | POA: Diagnosis not present

## 2019-03-31 DIAGNOSIS — Z6841 Body Mass Index (BMI) 40.0 and over, adult: Secondary | ICD-10-CM | POA: Diagnosis not present

## 2019-03-31 DIAGNOSIS — I10 Essential (primary) hypertension: Secondary | ICD-10-CM | POA: Diagnosis not present

## 2019-03-31 DIAGNOSIS — M160 Bilateral primary osteoarthritis of hip: Secondary | ICD-10-CM | POA: Diagnosis not present

## 2019-03-31 DIAGNOSIS — L89154 Pressure ulcer of sacral region, stage 4: Secondary | ICD-10-CM | POA: Diagnosis not present

## 2019-04-01 DIAGNOSIS — M160 Bilateral primary osteoarthritis of hip: Secondary | ICD-10-CM | POA: Diagnosis not present

## 2019-04-01 DIAGNOSIS — L89154 Pressure ulcer of sacral region, stage 4: Secondary | ICD-10-CM | POA: Diagnosis not present

## 2019-04-01 DIAGNOSIS — M4628 Osteomyelitis of vertebra, sacral and sacrococcygeal region: Secondary | ICD-10-CM | POA: Diagnosis not present

## 2019-04-01 DIAGNOSIS — Z6841 Body Mass Index (BMI) 40.0 and over, adult: Secondary | ICD-10-CM | POA: Diagnosis not present

## 2019-04-01 DIAGNOSIS — N950 Postmenopausal bleeding: Secondary | ICD-10-CM | POA: Diagnosis not present

## 2019-04-01 DIAGNOSIS — I10 Essential (primary) hypertension: Secondary | ICD-10-CM | POA: Diagnosis not present

## 2019-04-02 DIAGNOSIS — N950 Postmenopausal bleeding: Secondary | ICD-10-CM | POA: Diagnosis not present

## 2019-04-02 DIAGNOSIS — Z6841 Body Mass Index (BMI) 40.0 and over, adult: Secondary | ICD-10-CM | POA: Diagnosis not present

## 2019-04-02 DIAGNOSIS — M4628 Osteomyelitis of vertebra, sacral and sacrococcygeal region: Secondary | ICD-10-CM | POA: Diagnosis not present

## 2019-04-02 DIAGNOSIS — L89154 Pressure ulcer of sacral region, stage 4: Secondary | ICD-10-CM | POA: Diagnosis not present

## 2019-04-02 DIAGNOSIS — I10 Essential (primary) hypertension: Secondary | ICD-10-CM | POA: Diagnosis not present

## 2019-04-02 DIAGNOSIS — M160 Bilateral primary osteoarthritis of hip: Secondary | ICD-10-CM | POA: Diagnosis not present

## 2019-04-03 DIAGNOSIS — N950 Postmenopausal bleeding: Secondary | ICD-10-CM | POA: Diagnosis not present

## 2019-04-04 DIAGNOSIS — N950 Postmenopausal bleeding: Secondary | ICD-10-CM | POA: Diagnosis not present

## 2019-04-05 DIAGNOSIS — N950 Postmenopausal bleeding: Secondary | ICD-10-CM | POA: Diagnosis not present

## 2019-04-06 DIAGNOSIS — N950 Postmenopausal bleeding: Secondary | ICD-10-CM | POA: Diagnosis not present

## 2019-04-07 DIAGNOSIS — N950 Postmenopausal bleeding: Secondary | ICD-10-CM | POA: Diagnosis not present

## 2019-04-08 DIAGNOSIS — N950 Postmenopausal bleeding: Secondary | ICD-10-CM | POA: Diagnosis not present

## 2019-04-09 DIAGNOSIS — N950 Postmenopausal bleeding: Secondary | ICD-10-CM | POA: Diagnosis not present

## 2019-04-10 DIAGNOSIS — E1165 Type 2 diabetes mellitus with hyperglycemia: Secondary | ICD-10-CM | POA: Diagnosis not present

## 2019-04-10 DIAGNOSIS — N179 Acute kidney failure, unspecified: Secondary | ICD-10-CM | POA: Diagnosis not present

## 2019-04-10 DIAGNOSIS — L89154 Pressure ulcer of sacral region, stage 4: Secondary | ICD-10-CM | POA: Diagnosis not present

## 2019-04-10 DIAGNOSIS — M869 Osteomyelitis, unspecified: Secondary | ICD-10-CM | POA: Diagnosis not present

## 2019-04-10 DIAGNOSIS — N950 Postmenopausal bleeding: Secondary | ICD-10-CM | POA: Diagnosis not present

## 2019-04-10 DIAGNOSIS — M160 Bilateral primary osteoarthritis of hip: Secondary | ICD-10-CM | POA: Diagnosis not present

## 2019-04-11 DIAGNOSIS — N950 Postmenopausal bleeding: Secondary | ICD-10-CM | POA: Diagnosis not present

## 2019-04-11 DIAGNOSIS — N179 Acute kidney failure, unspecified: Secondary | ICD-10-CM | POA: Diagnosis not present

## 2019-04-11 DIAGNOSIS — M869 Osteomyelitis, unspecified: Secondary | ICD-10-CM | POA: Diagnosis not present

## 2019-04-11 DIAGNOSIS — E1165 Type 2 diabetes mellitus with hyperglycemia: Secondary | ICD-10-CM | POA: Diagnosis not present

## 2019-04-11 DIAGNOSIS — L89154 Pressure ulcer of sacral region, stage 4: Secondary | ICD-10-CM | POA: Diagnosis not present

## 2019-04-11 DIAGNOSIS — M160 Bilateral primary osteoarthritis of hip: Secondary | ICD-10-CM | POA: Diagnosis not present

## 2019-04-12 DIAGNOSIS — M869 Osteomyelitis, unspecified: Secondary | ICD-10-CM | POA: Diagnosis not present

## 2019-04-12 DIAGNOSIS — N179 Acute kidney failure, unspecified: Secondary | ICD-10-CM | POA: Diagnosis not present

## 2019-04-12 DIAGNOSIS — G5622 Lesion of ulnar nerve, left upper limb: Secondary | ICD-10-CM | POA: Diagnosis not present

## 2019-04-12 DIAGNOSIS — N950 Postmenopausal bleeding: Secondary | ICD-10-CM | POA: Diagnosis not present

## 2019-04-12 DIAGNOSIS — L89154 Pressure ulcer of sacral region, stage 4: Secondary | ICD-10-CM | POA: Diagnosis not present

## 2019-04-12 DIAGNOSIS — M160 Bilateral primary osteoarthritis of hip: Secondary | ICD-10-CM | POA: Diagnosis not present

## 2019-04-12 DIAGNOSIS — E1165 Type 2 diabetes mellitus with hyperglycemia: Secondary | ICD-10-CM | POA: Diagnosis not present

## 2019-04-13 DIAGNOSIS — G5622 Lesion of ulnar nerve, left upper limb: Secondary | ICD-10-CM | POA: Diagnosis not present

## 2019-04-13 DIAGNOSIS — N950 Postmenopausal bleeding: Secondary | ICD-10-CM | POA: Diagnosis not present

## 2019-04-13 DIAGNOSIS — L89154 Pressure ulcer of sacral region, stage 4: Secondary | ICD-10-CM | POA: Diagnosis not present

## 2019-04-13 DIAGNOSIS — M869 Osteomyelitis, unspecified: Secondary | ICD-10-CM | POA: Diagnosis not present

## 2019-04-13 DIAGNOSIS — M160 Bilateral primary osteoarthritis of hip: Secondary | ICD-10-CM | POA: Diagnosis not present

## 2019-04-13 DIAGNOSIS — N179 Acute kidney failure, unspecified: Secondary | ICD-10-CM | POA: Diagnosis not present

## 2019-04-13 DIAGNOSIS — E1165 Type 2 diabetes mellitus with hyperglycemia: Secondary | ICD-10-CM | POA: Diagnosis not present

## 2019-04-14 DIAGNOSIS — E1165 Type 2 diabetes mellitus with hyperglycemia: Secondary | ICD-10-CM | POA: Diagnosis not present

## 2019-04-14 DIAGNOSIS — G5622 Lesion of ulnar nerve, left upper limb: Secondary | ICD-10-CM | POA: Diagnosis not present

## 2019-04-14 DIAGNOSIS — N179 Acute kidney failure, unspecified: Secondary | ICD-10-CM | POA: Diagnosis not present

## 2019-04-14 DIAGNOSIS — M869 Osteomyelitis, unspecified: Secondary | ICD-10-CM | POA: Diagnosis not present

## 2019-04-14 DIAGNOSIS — M160 Bilateral primary osteoarthritis of hip: Secondary | ICD-10-CM | POA: Diagnosis not present

## 2019-04-14 DIAGNOSIS — L89154 Pressure ulcer of sacral region, stage 4: Secondary | ICD-10-CM | POA: Diagnosis not present

## 2019-04-14 DIAGNOSIS — N950 Postmenopausal bleeding: Secondary | ICD-10-CM | POA: Diagnosis not present

## 2019-04-15 DIAGNOSIS — N179 Acute kidney failure, unspecified: Secondary | ICD-10-CM | POA: Diagnosis not present

## 2019-04-15 DIAGNOSIS — M160 Bilateral primary osteoarthritis of hip: Secondary | ICD-10-CM | POA: Diagnosis not present

## 2019-04-15 DIAGNOSIS — N950 Postmenopausal bleeding: Secondary | ICD-10-CM | POA: Diagnosis not present

## 2019-04-15 DIAGNOSIS — M869 Osteomyelitis, unspecified: Secondary | ICD-10-CM | POA: Diagnosis not present

## 2019-04-15 DIAGNOSIS — E1165 Type 2 diabetes mellitus with hyperglycemia: Secondary | ICD-10-CM | POA: Diagnosis not present

## 2019-04-15 DIAGNOSIS — L89154 Pressure ulcer of sacral region, stage 4: Secondary | ICD-10-CM | POA: Diagnosis not present

## 2019-04-15 DIAGNOSIS — G5622 Lesion of ulnar nerve, left upper limb: Secondary | ICD-10-CM | POA: Diagnosis not present

## 2019-04-16 DIAGNOSIS — M160 Bilateral primary osteoarthritis of hip: Secondary | ICD-10-CM | POA: Diagnosis not present

## 2019-04-16 DIAGNOSIS — N179 Acute kidney failure, unspecified: Secondary | ICD-10-CM | POA: Diagnosis not present

## 2019-04-16 DIAGNOSIS — L89154 Pressure ulcer of sacral region, stage 4: Secondary | ICD-10-CM | POA: Diagnosis not present

## 2019-04-16 DIAGNOSIS — G5622 Lesion of ulnar nerve, left upper limb: Secondary | ICD-10-CM | POA: Diagnosis not present

## 2019-04-16 DIAGNOSIS — E1165 Type 2 diabetes mellitus with hyperglycemia: Secondary | ICD-10-CM | POA: Diagnosis not present

## 2019-04-16 DIAGNOSIS — M869 Osteomyelitis, unspecified: Secondary | ICD-10-CM | POA: Diagnosis not present

## 2019-04-16 DIAGNOSIS — N950 Postmenopausal bleeding: Secondary | ICD-10-CM | POA: Diagnosis not present

## 2019-04-17 DIAGNOSIS — N950 Postmenopausal bleeding: Secondary | ICD-10-CM | POA: Diagnosis not present

## 2019-04-18 DIAGNOSIS — N950 Postmenopausal bleeding: Secondary | ICD-10-CM | POA: Diagnosis not present

## 2019-04-20 DIAGNOSIS — N950 Postmenopausal bleeding: Secondary | ICD-10-CM | POA: Diagnosis not present

## 2019-04-21 DIAGNOSIS — N950 Postmenopausal bleeding: Secondary | ICD-10-CM | POA: Diagnosis not present

## 2019-04-22 DIAGNOSIS — N950 Postmenopausal bleeding: Secondary | ICD-10-CM | POA: Diagnosis not present

## 2019-04-23 DIAGNOSIS — N950 Postmenopausal bleeding: Secondary | ICD-10-CM | POA: Diagnosis not present

## 2019-04-24 DIAGNOSIS — N950 Postmenopausal bleeding: Secondary | ICD-10-CM | POA: Diagnosis not present

## 2019-04-24 DIAGNOSIS — M160 Bilateral primary osteoarthritis of hip: Secondary | ICD-10-CM | POA: Diagnosis not present

## 2019-04-24 DIAGNOSIS — L89154 Pressure ulcer of sacral region, stage 4: Secondary | ICD-10-CM | POA: Diagnosis not present

## 2019-04-25 DIAGNOSIS — L89154 Pressure ulcer of sacral region, stage 4: Secondary | ICD-10-CM | POA: Diagnosis not present

## 2019-04-25 DIAGNOSIS — M160 Bilateral primary osteoarthritis of hip: Secondary | ICD-10-CM | POA: Diagnosis not present

## 2019-04-25 DIAGNOSIS — N950 Postmenopausal bleeding: Secondary | ICD-10-CM | POA: Diagnosis not present

## 2019-04-26 DIAGNOSIS — N950 Postmenopausal bleeding: Secondary | ICD-10-CM | POA: Diagnosis not present

## 2019-04-26 DIAGNOSIS — M160 Bilateral primary osteoarthritis of hip: Secondary | ICD-10-CM | POA: Diagnosis not present

## 2019-04-26 DIAGNOSIS — L89154 Pressure ulcer of sacral region, stage 4: Secondary | ICD-10-CM | POA: Diagnosis not present

## 2019-04-27 DIAGNOSIS — M160 Bilateral primary osteoarthritis of hip: Secondary | ICD-10-CM | POA: Diagnosis not present

## 2019-04-27 DIAGNOSIS — N950 Postmenopausal bleeding: Secondary | ICD-10-CM | POA: Diagnosis not present

## 2019-04-27 DIAGNOSIS — L89154 Pressure ulcer of sacral region, stage 4: Secondary | ICD-10-CM | POA: Diagnosis not present

## 2019-04-28 DIAGNOSIS — L89154 Pressure ulcer of sacral region, stage 4: Secondary | ICD-10-CM | POA: Diagnosis not present

## 2019-04-28 DIAGNOSIS — M160 Bilateral primary osteoarthritis of hip: Secondary | ICD-10-CM | POA: Diagnosis not present

## 2019-04-28 DIAGNOSIS — N950 Postmenopausal bleeding: Secondary | ICD-10-CM | POA: Diagnosis not present

## 2019-04-29 DIAGNOSIS — N950 Postmenopausal bleeding: Secondary | ICD-10-CM | POA: Diagnosis not present

## 2019-04-29 DIAGNOSIS — M160 Bilateral primary osteoarthritis of hip: Secondary | ICD-10-CM | POA: Diagnosis not present

## 2019-04-29 DIAGNOSIS — L89154 Pressure ulcer of sacral region, stage 4: Secondary | ICD-10-CM | POA: Diagnosis not present

## 2019-04-30 DIAGNOSIS — L89154 Pressure ulcer of sacral region, stage 4: Secondary | ICD-10-CM | POA: Diagnosis not present

## 2019-04-30 DIAGNOSIS — N950 Postmenopausal bleeding: Secondary | ICD-10-CM | POA: Diagnosis not present

## 2019-04-30 DIAGNOSIS — M160 Bilateral primary osteoarthritis of hip: Secondary | ICD-10-CM | POA: Diagnosis not present

## 2019-05-01 DIAGNOSIS — N950 Postmenopausal bleeding: Secondary | ICD-10-CM | POA: Diagnosis not present

## 2019-05-01 DIAGNOSIS — I890 Lymphedema, not elsewhere classified: Secondary | ICD-10-CM | POA: Diagnosis not present

## 2019-05-01 DIAGNOSIS — L89154 Pressure ulcer of sacral region, stage 4: Secondary | ICD-10-CM | POA: Diagnosis not present

## 2019-05-02 DIAGNOSIS — I890 Lymphedema, not elsewhere classified: Secondary | ICD-10-CM | POA: Diagnosis not present

## 2019-05-02 DIAGNOSIS — L89154 Pressure ulcer of sacral region, stage 4: Secondary | ICD-10-CM | POA: Diagnosis not present

## 2019-05-02 DIAGNOSIS — N950 Postmenopausal bleeding: Secondary | ICD-10-CM | POA: Diagnosis not present

## 2019-05-03 DIAGNOSIS — I890 Lymphedema, not elsewhere classified: Secondary | ICD-10-CM | POA: Diagnosis not present

## 2019-05-03 DIAGNOSIS — L89154 Pressure ulcer of sacral region, stage 4: Secondary | ICD-10-CM | POA: Diagnosis not present

## 2019-05-03 DIAGNOSIS — N950 Postmenopausal bleeding: Secondary | ICD-10-CM | POA: Diagnosis not present

## 2019-05-04 DIAGNOSIS — I890 Lymphedema, not elsewhere classified: Secondary | ICD-10-CM | POA: Diagnosis not present

## 2019-05-04 DIAGNOSIS — N950 Postmenopausal bleeding: Secondary | ICD-10-CM | POA: Diagnosis not present

## 2019-05-04 DIAGNOSIS — L89154 Pressure ulcer of sacral region, stage 4: Secondary | ICD-10-CM | POA: Diagnosis not present

## 2019-05-05 DIAGNOSIS — N950 Postmenopausal bleeding: Secondary | ICD-10-CM | POA: Diagnosis not present

## 2019-05-05 DIAGNOSIS — L89154 Pressure ulcer of sacral region, stage 4: Secondary | ICD-10-CM | POA: Diagnosis not present

## 2019-05-05 DIAGNOSIS — I890 Lymphedema, not elsewhere classified: Secondary | ICD-10-CM | POA: Diagnosis not present

## 2019-05-06 DIAGNOSIS — N950 Postmenopausal bleeding: Secondary | ICD-10-CM | POA: Diagnosis not present

## 2019-05-06 DIAGNOSIS — L89154 Pressure ulcer of sacral region, stage 4: Secondary | ICD-10-CM | POA: Diagnosis not present

## 2019-05-06 DIAGNOSIS — I890 Lymphedema, not elsewhere classified: Secondary | ICD-10-CM | POA: Diagnosis not present

## 2019-05-07 DIAGNOSIS — I890 Lymphedema, not elsewhere classified: Secondary | ICD-10-CM | POA: Diagnosis not present

## 2019-05-07 DIAGNOSIS — N950 Postmenopausal bleeding: Secondary | ICD-10-CM | POA: Diagnosis not present

## 2019-05-07 DIAGNOSIS — L89154 Pressure ulcer of sacral region, stage 4: Secondary | ICD-10-CM | POA: Diagnosis not present

## 2019-05-08 DIAGNOSIS — N950 Postmenopausal bleeding: Secondary | ICD-10-CM | POA: Diagnosis not present

## 2019-05-09 DIAGNOSIS — N950 Postmenopausal bleeding: Secondary | ICD-10-CM | POA: Diagnosis not present

## 2019-05-10 DIAGNOSIS — N950 Postmenopausal bleeding: Secondary | ICD-10-CM | POA: Diagnosis not present

## 2019-05-11 DIAGNOSIS — N950 Postmenopausal bleeding: Secondary | ICD-10-CM | POA: Diagnosis not present

## 2019-05-12 DIAGNOSIS — N950 Postmenopausal bleeding: Secondary | ICD-10-CM | POA: Diagnosis not present

## 2019-05-13 DIAGNOSIS — N950 Postmenopausal bleeding: Secondary | ICD-10-CM | POA: Diagnosis not present

## 2019-05-14 DIAGNOSIS — N950 Postmenopausal bleeding: Secondary | ICD-10-CM | POA: Diagnosis not present

## 2019-05-15 DIAGNOSIS — M25559 Pain in unspecified hip: Secondary | ICD-10-CM | POA: Diagnosis not present

## 2019-05-15 DIAGNOSIS — L89154 Pressure ulcer of sacral region, stage 4: Secondary | ICD-10-CM | POA: Diagnosis not present

## 2019-05-15 DIAGNOSIS — E1165 Type 2 diabetes mellitus with hyperglycemia: Secondary | ICD-10-CM | POA: Diagnosis not present

## 2019-05-15 DIAGNOSIS — M4628 Osteomyelitis of vertebra, sacral and sacrococcygeal region: Secondary | ICD-10-CM | POA: Diagnosis not present

## 2019-05-15 DIAGNOSIS — R0603 Acute respiratory distress: Secondary | ICD-10-CM | POA: Diagnosis not present

## 2019-05-15 DIAGNOSIS — I10 Essential (primary) hypertension: Secondary | ICD-10-CM | POA: Diagnosis not present

## 2019-05-15 DIAGNOSIS — Z0441 Encounter for examination and observation following alleged adult rape: Secondary | ICD-10-CM | POA: Diagnosis not present

## 2019-05-15 DIAGNOSIS — Z7401 Bed confinement status: Secondary | ICD-10-CM | POA: Diagnosis not present

## 2019-05-15 DIAGNOSIS — N179 Acute kidney failure, unspecified: Secondary | ICD-10-CM | POA: Diagnosis not present

## 2019-05-15 DIAGNOSIS — F4312 Post-traumatic stress disorder, chronic: Secondary | ICD-10-CM | POA: Diagnosis not present

## 2019-05-15 DIAGNOSIS — I890 Lymphedema, not elsewhere classified: Secondary | ICD-10-CM | POA: Diagnosis not present

## 2019-05-15 DIAGNOSIS — Z9181 History of falling: Secondary | ICD-10-CM | POA: Diagnosis not present

## 2019-05-15 DIAGNOSIS — L89159 Pressure ulcer of sacral region, unspecified stage: Secondary | ICD-10-CM | POA: Diagnosis not present

## 2019-05-15 DIAGNOSIS — Z6841 Body Mass Index (BMI) 40.0 and over, adult: Secondary | ICD-10-CM | POA: Diagnosis not present

## 2019-05-15 DIAGNOSIS — N950 Postmenopausal bleeding: Secondary | ICD-10-CM | POA: Diagnosis not present

## 2019-05-16 DIAGNOSIS — D638 Anemia in other chronic diseases classified elsewhere: Secondary | ICD-10-CM | POA: Diagnosis not present

## 2019-05-16 DIAGNOSIS — L89154 Pressure ulcer of sacral region, stage 4: Secondary | ICD-10-CM | POA: Diagnosis not present

## 2019-05-16 DIAGNOSIS — M4628 Osteomyelitis of vertebra, sacral and sacrococcygeal region: Secondary | ICD-10-CM | POA: Diagnosis not present

## 2019-05-16 DIAGNOSIS — I10 Essential (primary) hypertension: Secondary | ICD-10-CM | POA: Diagnosis not present

## 2019-05-16 DIAGNOSIS — G5620 Lesion of ulnar nerve, unspecified upper limb: Secondary | ICD-10-CM | POA: Diagnosis not present

## 2019-05-16 DIAGNOSIS — M25562 Pain in left knee: Secondary | ICD-10-CM | POA: Diagnosis not present

## 2019-05-16 DIAGNOSIS — G8929 Other chronic pain: Secondary | ICD-10-CM | POA: Diagnosis not present

## 2019-05-16 DIAGNOSIS — Z433 Encounter for attention to colostomy: Secondary | ICD-10-CM | POA: Diagnosis not present

## 2019-05-16 DIAGNOSIS — M1990 Unspecified osteoarthritis, unspecified site: Secondary | ICD-10-CM | POA: Diagnosis not present

## 2019-05-16 DIAGNOSIS — M160 Bilateral primary osteoarthritis of hip: Secondary | ICD-10-CM | POA: Diagnosis not present

## 2019-05-16 DIAGNOSIS — M25561 Pain in right knee: Secondary | ICD-10-CM | POA: Diagnosis not present

## 2019-05-16 DIAGNOSIS — F4310 Post-traumatic stress disorder, unspecified: Secondary | ICD-10-CM | POA: Diagnosis not present

## 2019-05-16 DIAGNOSIS — E1165 Type 2 diabetes mellitus with hyperglycemia: Secondary | ICD-10-CM | POA: Diagnosis not present

## 2019-05-16 DIAGNOSIS — Z96653 Presence of artificial knee joint, bilateral: Secondary | ICD-10-CM | POA: Diagnosis not present

## 2019-05-16 DIAGNOSIS — Z48817 Encounter for surgical aftercare following surgery on the skin and subcutaneous tissue: Secondary | ICD-10-CM | POA: Diagnosis not present

## 2019-05-16 DIAGNOSIS — L89622 Pressure ulcer of left heel, stage 2: Secondary | ICD-10-CM | POA: Diagnosis not present

## 2019-05-16 DIAGNOSIS — N950 Postmenopausal bleeding: Secondary | ICD-10-CM | POA: Diagnosis not present

## 2019-05-16 DIAGNOSIS — I890 Lymphedema, not elsewhere classified: Secondary | ICD-10-CM | POA: Diagnosis not present

## 2019-05-16 DIAGNOSIS — N840 Polyp of corpus uteri: Secondary | ICD-10-CM | POA: Diagnosis not present

## 2019-05-18 DIAGNOSIS — Z48817 Encounter for surgical aftercare following surgery on the skin and subcutaneous tissue: Secondary | ICD-10-CM | POA: Diagnosis not present

## 2019-05-18 DIAGNOSIS — L89622 Pressure ulcer of left heel, stage 2: Secondary | ICD-10-CM | POA: Diagnosis not present

## 2019-05-18 DIAGNOSIS — M4628 Osteomyelitis of vertebra, sacral and sacrococcygeal region: Secondary | ICD-10-CM | POA: Diagnosis not present

## 2019-05-18 DIAGNOSIS — G8929 Other chronic pain: Secondary | ICD-10-CM | POA: Diagnosis not present

## 2019-05-18 DIAGNOSIS — L89154 Pressure ulcer of sacral region, stage 4: Secondary | ICD-10-CM | POA: Diagnosis not present

## 2019-05-18 DIAGNOSIS — Z433 Encounter for attention to colostomy: Secondary | ICD-10-CM | POA: Diagnosis not present

## 2019-05-20 DIAGNOSIS — G8929 Other chronic pain: Secondary | ICD-10-CM | POA: Diagnosis not present

## 2019-05-20 DIAGNOSIS — Z48817 Encounter for surgical aftercare following surgery on the skin and subcutaneous tissue: Secondary | ICD-10-CM | POA: Diagnosis not present

## 2019-05-20 DIAGNOSIS — L89622 Pressure ulcer of left heel, stage 2: Secondary | ICD-10-CM | POA: Diagnosis not present

## 2019-05-20 DIAGNOSIS — Z433 Encounter for attention to colostomy: Secondary | ICD-10-CM | POA: Diagnosis not present

## 2019-05-20 DIAGNOSIS — M4628 Osteomyelitis of vertebra, sacral and sacrococcygeal region: Secondary | ICD-10-CM | POA: Diagnosis not present

## 2019-05-20 DIAGNOSIS — L89154 Pressure ulcer of sacral region, stage 4: Secondary | ICD-10-CM | POA: Diagnosis not present

## 2019-05-24 DIAGNOSIS — Z48817 Encounter for surgical aftercare following surgery on the skin and subcutaneous tissue: Secondary | ICD-10-CM | POA: Diagnosis not present

## 2019-05-24 DIAGNOSIS — L89154 Pressure ulcer of sacral region, stage 4: Secondary | ICD-10-CM | POA: Diagnosis not present

## 2019-05-24 DIAGNOSIS — L89622 Pressure ulcer of left heel, stage 2: Secondary | ICD-10-CM | POA: Diagnosis not present

## 2019-05-24 DIAGNOSIS — G8929 Other chronic pain: Secondary | ICD-10-CM | POA: Diagnosis not present

## 2019-05-24 DIAGNOSIS — Z433 Encounter for attention to colostomy: Secondary | ICD-10-CM | POA: Diagnosis not present

## 2019-05-24 DIAGNOSIS — M4628 Osteomyelitis of vertebra, sacral and sacrococcygeal region: Secondary | ICD-10-CM | POA: Diagnosis not present

## 2019-05-25 DIAGNOSIS — Z48817 Encounter for surgical aftercare following surgery on the skin and subcutaneous tissue: Secondary | ICD-10-CM | POA: Diagnosis not present

## 2019-05-25 DIAGNOSIS — L89154 Pressure ulcer of sacral region, stage 4: Secondary | ICD-10-CM | POA: Diagnosis not present

## 2019-05-25 DIAGNOSIS — G8929 Other chronic pain: Secondary | ICD-10-CM | POA: Diagnosis not present

## 2019-05-25 DIAGNOSIS — L89622 Pressure ulcer of left heel, stage 2: Secondary | ICD-10-CM | POA: Diagnosis not present

## 2019-05-25 DIAGNOSIS — Z433 Encounter for attention to colostomy: Secondary | ICD-10-CM | POA: Diagnosis not present

## 2019-05-25 DIAGNOSIS — M4628 Osteomyelitis of vertebra, sacral and sacrococcygeal region: Secondary | ICD-10-CM | POA: Diagnosis not present

## 2019-05-27 DIAGNOSIS — Z48817 Encounter for surgical aftercare following surgery on the skin and subcutaneous tissue: Secondary | ICD-10-CM | POA: Diagnosis not present

## 2019-05-27 DIAGNOSIS — G8929 Other chronic pain: Secondary | ICD-10-CM | POA: Diagnosis not present

## 2019-05-27 DIAGNOSIS — L89622 Pressure ulcer of left heel, stage 2: Secondary | ICD-10-CM | POA: Diagnosis not present

## 2019-05-27 DIAGNOSIS — L89154 Pressure ulcer of sacral region, stage 4: Secondary | ICD-10-CM | POA: Diagnosis not present

## 2019-05-27 DIAGNOSIS — M4628 Osteomyelitis of vertebra, sacral and sacrococcygeal region: Secondary | ICD-10-CM | POA: Diagnosis not present

## 2019-05-27 DIAGNOSIS — Z433 Encounter for attention to colostomy: Secondary | ICD-10-CM | POA: Diagnosis not present

## 2019-06-01 DIAGNOSIS — Z48817 Encounter for surgical aftercare following surgery on the skin and subcutaneous tissue: Secondary | ICD-10-CM | POA: Diagnosis not present

## 2019-06-01 DIAGNOSIS — L89622 Pressure ulcer of left heel, stage 2: Secondary | ICD-10-CM | POA: Diagnosis not present

## 2019-06-01 DIAGNOSIS — M4628 Osteomyelitis of vertebra, sacral and sacrococcygeal region: Secondary | ICD-10-CM | POA: Diagnosis not present

## 2019-06-01 DIAGNOSIS — G8929 Other chronic pain: Secondary | ICD-10-CM | POA: Diagnosis not present

## 2019-06-01 DIAGNOSIS — L89154 Pressure ulcer of sacral region, stage 4: Secondary | ICD-10-CM | POA: Diagnosis not present

## 2019-06-01 DIAGNOSIS — Z433 Encounter for attention to colostomy: Secondary | ICD-10-CM | POA: Diagnosis not present

## 2019-06-03 DIAGNOSIS — Z48817 Encounter for surgical aftercare following surgery on the skin and subcutaneous tissue: Secondary | ICD-10-CM | POA: Diagnosis not present

## 2019-06-03 DIAGNOSIS — M4628 Osteomyelitis of vertebra, sacral and sacrococcygeal region: Secondary | ICD-10-CM | POA: Diagnosis not present

## 2019-06-03 DIAGNOSIS — L89154 Pressure ulcer of sacral region, stage 4: Secondary | ICD-10-CM | POA: Diagnosis not present

## 2019-06-03 DIAGNOSIS — Z433 Encounter for attention to colostomy: Secondary | ICD-10-CM | POA: Diagnosis not present

## 2019-06-03 DIAGNOSIS — L89622 Pressure ulcer of left heel, stage 2: Secondary | ICD-10-CM | POA: Diagnosis not present

## 2019-06-03 DIAGNOSIS — G8929 Other chronic pain: Secondary | ICD-10-CM | POA: Diagnosis not present

## 2019-06-08 DIAGNOSIS — Z433 Encounter for attention to colostomy: Secondary | ICD-10-CM | POA: Diagnosis not present

## 2019-06-08 DIAGNOSIS — Z48817 Encounter for surgical aftercare following surgery on the skin and subcutaneous tissue: Secondary | ICD-10-CM | POA: Diagnosis not present

## 2019-06-08 DIAGNOSIS — M4628 Osteomyelitis of vertebra, sacral and sacrococcygeal region: Secondary | ICD-10-CM | POA: Diagnosis not present

## 2019-06-08 DIAGNOSIS — L89622 Pressure ulcer of left heel, stage 2: Secondary | ICD-10-CM | POA: Diagnosis not present

## 2019-06-08 DIAGNOSIS — L89154 Pressure ulcer of sacral region, stage 4: Secondary | ICD-10-CM | POA: Diagnosis not present

## 2019-06-08 DIAGNOSIS — G8929 Other chronic pain: Secondary | ICD-10-CM | POA: Diagnosis not present

## 2019-06-09 DIAGNOSIS — G8929 Other chronic pain: Secondary | ICD-10-CM | POA: Diagnosis not present

## 2019-06-09 DIAGNOSIS — M4628 Osteomyelitis of vertebra, sacral and sacrococcygeal region: Secondary | ICD-10-CM | POA: Diagnosis not present

## 2019-06-09 DIAGNOSIS — M25552 Pain in left hip: Secondary | ICD-10-CM | POA: Diagnosis not present

## 2019-06-09 DIAGNOSIS — N179 Acute kidney failure, unspecified: Secondary | ICD-10-CM | POA: Diagnosis not present

## 2019-06-09 DIAGNOSIS — Z7401 Bed confinement status: Secondary | ICD-10-CM | POA: Diagnosis not present

## 2019-06-09 DIAGNOSIS — E1165 Type 2 diabetes mellitus with hyperglycemia: Secondary | ICD-10-CM | POA: Diagnosis not present

## 2019-06-09 DIAGNOSIS — D509 Iron deficiency anemia, unspecified: Secondary | ICD-10-CM | POA: Diagnosis not present

## 2019-06-09 DIAGNOSIS — L89154 Pressure ulcer of sacral region, stage 4: Secondary | ICD-10-CM | POA: Diagnosis not present

## 2019-06-09 DIAGNOSIS — Z433 Encounter for attention to colostomy: Secondary | ICD-10-CM | POA: Diagnosis not present

## 2019-06-09 DIAGNOSIS — L89622 Pressure ulcer of left heel, stage 2: Secondary | ICD-10-CM | POA: Diagnosis not present

## 2019-06-09 DIAGNOSIS — I10 Essential (primary) hypertension: Secondary | ICD-10-CM | POA: Diagnosis not present

## 2019-06-09 DIAGNOSIS — M25551 Pain in right hip: Secondary | ICD-10-CM | POA: Diagnosis not present

## 2019-06-09 DIAGNOSIS — Z48817 Encounter for surgical aftercare following surgery on the skin and subcutaneous tissue: Secondary | ICD-10-CM | POA: Diagnosis not present

## 2019-06-15 DIAGNOSIS — Z433 Encounter for attention to colostomy: Secondary | ICD-10-CM | POA: Diagnosis not present

## 2019-06-15 DIAGNOSIS — Z96653 Presence of artificial knee joint, bilateral: Secondary | ICD-10-CM | POA: Diagnosis not present

## 2019-06-15 DIAGNOSIS — F4310 Post-traumatic stress disorder, unspecified: Secondary | ICD-10-CM | POA: Diagnosis not present

## 2019-06-15 DIAGNOSIS — I10 Essential (primary) hypertension: Secondary | ICD-10-CM | POA: Diagnosis not present

## 2019-06-15 DIAGNOSIS — G5620 Lesion of ulnar nerve, unspecified upper limb: Secondary | ICD-10-CM | POA: Diagnosis not present

## 2019-06-15 DIAGNOSIS — Z48817 Encounter for surgical aftercare following surgery on the skin and subcutaneous tissue: Secondary | ICD-10-CM | POA: Diagnosis not present

## 2019-06-15 DIAGNOSIS — L89154 Pressure ulcer of sacral region, stage 4: Secondary | ICD-10-CM | POA: Diagnosis not present

## 2019-06-15 DIAGNOSIS — M25561 Pain in right knee: Secondary | ICD-10-CM | POA: Diagnosis not present

## 2019-06-15 DIAGNOSIS — M1990 Unspecified osteoarthritis, unspecified site: Secondary | ICD-10-CM | POA: Diagnosis not present

## 2019-06-15 DIAGNOSIS — E1165 Type 2 diabetes mellitus with hyperglycemia: Secondary | ICD-10-CM | POA: Diagnosis not present

## 2019-06-15 DIAGNOSIS — D638 Anemia in other chronic diseases classified elsewhere: Secondary | ICD-10-CM | POA: Diagnosis not present

## 2019-06-15 DIAGNOSIS — M160 Bilateral primary osteoarthritis of hip: Secondary | ICD-10-CM | POA: Diagnosis not present

## 2019-06-15 DIAGNOSIS — I890 Lymphedema, not elsewhere classified: Secondary | ICD-10-CM | POA: Diagnosis not present

## 2019-06-15 DIAGNOSIS — L89622 Pressure ulcer of left heel, stage 2: Secondary | ICD-10-CM | POA: Diagnosis not present

## 2019-06-15 DIAGNOSIS — N840 Polyp of corpus uteri: Secondary | ICD-10-CM | POA: Diagnosis not present

## 2019-06-15 DIAGNOSIS — N950 Postmenopausal bleeding: Secondary | ICD-10-CM | POA: Diagnosis not present

## 2019-06-15 DIAGNOSIS — G8929 Other chronic pain: Secondary | ICD-10-CM | POA: Diagnosis not present

## 2019-06-15 DIAGNOSIS — M4628 Osteomyelitis of vertebra, sacral and sacrococcygeal region: Secondary | ICD-10-CM | POA: Diagnosis not present

## 2019-06-15 DIAGNOSIS — M25562 Pain in left knee: Secondary | ICD-10-CM | POA: Diagnosis not present

## 2019-06-16 DIAGNOSIS — Z48817 Encounter for surgical aftercare following surgery on the skin and subcutaneous tissue: Secondary | ICD-10-CM | POA: Diagnosis not present

## 2019-06-16 DIAGNOSIS — L89622 Pressure ulcer of left heel, stage 2: Secondary | ICD-10-CM | POA: Diagnosis not present

## 2019-06-16 DIAGNOSIS — Z433 Encounter for attention to colostomy: Secondary | ICD-10-CM | POA: Diagnosis not present

## 2019-06-16 DIAGNOSIS — G8929 Other chronic pain: Secondary | ICD-10-CM | POA: Diagnosis not present

## 2019-06-16 DIAGNOSIS — L89154 Pressure ulcer of sacral region, stage 4: Secondary | ICD-10-CM | POA: Diagnosis not present

## 2019-06-16 DIAGNOSIS — M4628 Osteomyelitis of vertebra, sacral and sacrococcygeal region: Secondary | ICD-10-CM | POA: Diagnosis not present

## 2019-06-17 DIAGNOSIS — Z7401 Bed confinement status: Secondary | ICD-10-CM | POA: Diagnosis not present

## 2019-06-17 DIAGNOSIS — L89899 Pressure ulcer of other site, unspecified stage: Secondary | ICD-10-CM | POA: Diagnosis not present

## 2019-06-17 DIAGNOSIS — G8929 Other chronic pain: Secondary | ICD-10-CM | POA: Diagnosis not present

## 2019-06-17 DIAGNOSIS — I890 Lymphedema, not elsewhere classified: Secondary | ICD-10-CM | POA: Diagnosis not present

## 2019-06-17 DIAGNOSIS — M4628 Osteomyelitis of vertebra, sacral and sacrococcygeal region: Secondary | ICD-10-CM | POA: Diagnosis not present

## 2019-06-17 DIAGNOSIS — M25551 Pain in right hip: Secondary | ICD-10-CM | POA: Diagnosis not present

## 2019-06-17 DIAGNOSIS — Z5181 Encounter for therapeutic drug level monitoring: Secondary | ICD-10-CM | POA: Diagnosis not present

## 2019-06-17 DIAGNOSIS — F4312 Post-traumatic stress disorder, chronic: Secondary | ICD-10-CM | POA: Diagnosis not present

## 2019-06-17 DIAGNOSIS — L89154 Pressure ulcer of sacral region, stage 4: Secondary | ICD-10-CM | POA: Diagnosis not present

## 2019-06-17 DIAGNOSIS — M25552 Pain in left hip: Secondary | ICD-10-CM | POA: Diagnosis not present

## 2019-06-21 DIAGNOSIS — L89154 Pressure ulcer of sacral region, stage 4: Secondary | ICD-10-CM | POA: Diagnosis not present

## 2019-06-21 DIAGNOSIS — Z433 Encounter for attention to colostomy: Secondary | ICD-10-CM | POA: Diagnosis not present

## 2019-06-21 DIAGNOSIS — Z48817 Encounter for surgical aftercare following surgery on the skin and subcutaneous tissue: Secondary | ICD-10-CM | POA: Diagnosis not present

## 2019-06-21 DIAGNOSIS — G8929 Other chronic pain: Secondary | ICD-10-CM | POA: Diagnosis not present

## 2019-06-21 DIAGNOSIS — L89622 Pressure ulcer of left heel, stage 2: Secondary | ICD-10-CM | POA: Diagnosis not present

## 2019-06-21 DIAGNOSIS — M4628 Osteomyelitis of vertebra, sacral and sacrococcygeal region: Secondary | ICD-10-CM | POA: Diagnosis not present

## 2019-06-24 DIAGNOSIS — L89154 Pressure ulcer of sacral region, stage 4: Secondary | ICD-10-CM | POA: Diagnosis not present

## 2019-06-24 DIAGNOSIS — G8929 Other chronic pain: Secondary | ICD-10-CM | POA: Diagnosis not present

## 2019-06-24 DIAGNOSIS — Z48817 Encounter for surgical aftercare following surgery on the skin and subcutaneous tissue: Secondary | ICD-10-CM | POA: Diagnosis not present

## 2019-06-24 DIAGNOSIS — L89622 Pressure ulcer of left heel, stage 2: Secondary | ICD-10-CM | POA: Diagnosis not present

## 2019-06-24 DIAGNOSIS — Z433 Encounter for attention to colostomy: Secondary | ICD-10-CM | POA: Diagnosis not present

## 2019-06-24 DIAGNOSIS — M4628 Osteomyelitis of vertebra, sacral and sacrococcygeal region: Secondary | ICD-10-CM | POA: Diagnosis not present

## 2019-07-06 DIAGNOSIS — I890 Lymphedema, not elsewhere classified: Secondary | ICD-10-CM | POA: Diagnosis not present

## 2019-07-06 DIAGNOSIS — N050 Unspecified nephritic syndrome with minor glomerular abnormality: Secondary | ICD-10-CM | POA: Diagnosis not present

## 2019-07-06 DIAGNOSIS — Z96653 Presence of artificial knee joint, bilateral: Secondary | ICD-10-CM | POA: Diagnosis not present

## 2019-07-06 DIAGNOSIS — L89154 Pressure ulcer of sacral region, stage 4: Secondary | ICD-10-CM | POA: Diagnosis not present

## 2019-07-06 DIAGNOSIS — M160 Bilateral primary osteoarthritis of hip: Secondary | ICD-10-CM | POA: Diagnosis not present

## 2019-07-06 DIAGNOSIS — Z7401 Bed confinement status: Secondary | ICD-10-CM | POA: Diagnosis not present

## 2019-07-06 DIAGNOSIS — F4310 Post-traumatic stress disorder, unspecified: Secondary | ICD-10-CM | POA: Diagnosis not present

## 2019-07-06 DIAGNOSIS — F331 Major depressive disorder, recurrent, moderate: Secondary | ICD-10-CM | POA: Diagnosis not present

## 2019-07-06 DIAGNOSIS — E119 Type 2 diabetes mellitus without complications: Secondary | ICD-10-CM | POA: Diagnosis not present

## 2019-07-06 DIAGNOSIS — M4628 Osteomyelitis of vertebra, sacral and sacrococcygeal region: Secondary | ICD-10-CM | POA: Diagnosis not present

## 2019-07-06 DIAGNOSIS — I10 Essential (primary) hypertension: Secondary | ICD-10-CM | POA: Diagnosis not present

## 2019-07-06 DIAGNOSIS — G8929 Other chronic pain: Secondary | ICD-10-CM | POA: Diagnosis not present

## 2019-07-06 DIAGNOSIS — D509 Iron deficiency anemia, unspecified: Secondary | ICD-10-CM | POA: Diagnosis not present

## 2019-07-08 DIAGNOSIS — D509 Iron deficiency anemia, unspecified: Secondary | ICD-10-CM | POA: Diagnosis not present

## 2019-07-08 DIAGNOSIS — Z23 Encounter for immunization: Secondary | ICD-10-CM | POA: Diagnosis not present

## 2019-07-08 DIAGNOSIS — Z7401 Bed confinement status: Secondary | ICD-10-CM | POA: Diagnosis not present

## 2019-07-08 DIAGNOSIS — Z1211 Encounter for screening for malignant neoplasm of colon: Secondary | ICD-10-CM | POA: Diagnosis not present

## 2019-07-08 DIAGNOSIS — E1165 Type 2 diabetes mellitus with hyperglycemia: Secondary | ICD-10-CM | POA: Diagnosis not present

## 2019-07-08 DIAGNOSIS — M160 Bilateral primary osteoarthritis of hip: Secondary | ICD-10-CM | POA: Diagnosis not present

## 2019-07-08 DIAGNOSIS — L602 Onychogryphosis: Secondary | ICD-10-CM | POA: Diagnosis not present

## 2019-07-08 DIAGNOSIS — I10 Essential (primary) hypertension: Secondary | ICD-10-CM | POA: Diagnosis not present

## 2019-07-09 DIAGNOSIS — E119 Type 2 diabetes mellitus without complications: Secondary | ICD-10-CM | POA: Diagnosis not present

## 2019-07-09 DIAGNOSIS — M160 Bilateral primary osteoarthritis of hip: Secondary | ICD-10-CM | POA: Diagnosis not present

## 2019-07-09 DIAGNOSIS — G8929 Other chronic pain: Secondary | ICD-10-CM | POA: Diagnosis not present

## 2019-07-09 DIAGNOSIS — I10 Essential (primary) hypertension: Secondary | ICD-10-CM | POA: Diagnosis not present

## 2019-07-09 DIAGNOSIS — M4628 Osteomyelitis of vertebra, sacral and sacrococcygeal region: Secondary | ICD-10-CM | POA: Diagnosis not present

## 2019-07-09 DIAGNOSIS — L89154 Pressure ulcer of sacral region, stage 4: Secondary | ICD-10-CM | POA: Diagnosis not present

## 2019-07-13 DIAGNOSIS — G8929 Other chronic pain: Secondary | ICD-10-CM | POA: Diagnosis not present

## 2019-07-13 DIAGNOSIS — I10 Essential (primary) hypertension: Secondary | ICD-10-CM | POA: Diagnosis not present

## 2019-07-13 DIAGNOSIS — M4628 Osteomyelitis of vertebra, sacral and sacrococcygeal region: Secondary | ICD-10-CM | POA: Diagnosis not present

## 2019-07-13 DIAGNOSIS — E119 Type 2 diabetes mellitus without complications: Secondary | ICD-10-CM | POA: Diagnosis not present

## 2019-07-13 DIAGNOSIS — L89154 Pressure ulcer of sacral region, stage 4: Secondary | ICD-10-CM | POA: Diagnosis not present

## 2019-07-13 DIAGNOSIS — M160 Bilateral primary osteoarthritis of hip: Secondary | ICD-10-CM | POA: Diagnosis not present

## 2019-07-14 DIAGNOSIS — G8929 Other chronic pain: Secondary | ICD-10-CM | POA: Diagnosis not present

## 2019-07-14 DIAGNOSIS — M4628 Osteomyelitis of vertebra, sacral and sacrococcygeal region: Secondary | ICD-10-CM | POA: Diagnosis not present

## 2019-07-14 DIAGNOSIS — M25551 Pain in right hip: Secondary | ICD-10-CM | POA: Diagnosis not present

## 2019-07-14 DIAGNOSIS — Z5181 Encounter for therapeutic drug level monitoring: Secondary | ICD-10-CM | POA: Diagnosis not present

## 2019-07-14 DIAGNOSIS — I890 Lymphedema, not elsewhere classified: Secondary | ICD-10-CM | POA: Diagnosis not present

## 2019-07-14 DIAGNOSIS — L89154 Pressure ulcer of sacral region, stage 4: Secondary | ICD-10-CM | POA: Diagnosis not present

## 2019-07-14 DIAGNOSIS — Z7401 Bed confinement status: Secondary | ICD-10-CM | POA: Diagnosis not present

## 2019-07-14 DIAGNOSIS — M25552 Pain in left hip: Secondary | ICD-10-CM | POA: Diagnosis not present

## 2019-07-16 DIAGNOSIS — M4628 Osteomyelitis of vertebra, sacral and sacrococcygeal region: Secondary | ICD-10-CM | POA: Diagnosis not present

## 2019-07-16 DIAGNOSIS — M160 Bilateral primary osteoarthritis of hip: Secondary | ICD-10-CM | POA: Diagnosis not present

## 2019-07-16 DIAGNOSIS — G8929 Other chronic pain: Secondary | ICD-10-CM | POA: Diagnosis not present

## 2019-07-16 DIAGNOSIS — L89154 Pressure ulcer of sacral region, stage 4: Secondary | ICD-10-CM | POA: Diagnosis not present

## 2019-07-16 DIAGNOSIS — I10 Essential (primary) hypertension: Secondary | ICD-10-CM | POA: Diagnosis not present

## 2019-07-16 DIAGNOSIS — E119 Type 2 diabetes mellitus without complications: Secondary | ICD-10-CM | POA: Diagnosis not present

## 2019-07-20 DIAGNOSIS — G8929 Other chronic pain: Secondary | ICD-10-CM | POA: Diagnosis not present

## 2019-07-20 DIAGNOSIS — M4628 Osteomyelitis of vertebra, sacral and sacrococcygeal region: Secondary | ICD-10-CM | POA: Diagnosis not present

## 2019-07-20 DIAGNOSIS — E119 Type 2 diabetes mellitus without complications: Secondary | ICD-10-CM | POA: Diagnosis not present

## 2019-07-20 DIAGNOSIS — M160 Bilateral primary osteoarthritis of hip: Secondary | ICD-10-CM | POA: Diagnosis not present

## 2019-07-20 DIAGNOSIS — L89154 Pressure ulcer of sacral region, stage 4: Secondary | ICD-10-CM | POA: Diagnosis not present

## 2019-07-20 DIAGNOSIS — I10 Essential (primary) hypertension: Secondary | ICD-10-CM | POA: Diagnosis not present

## 2019-07-21 DIAGNOSIS — G8929 Other chronic pain: Secondary | ICD-10-CM | POA: Diagnosis not present

## 2019-07-21 DIAGNOSIS — L89154 Pressure ulcer of sacral region, stage 4: Secondary | ICD-10-CM | POA: Diagnosis not present

## 2019-07-21 DIAGNOSIS — M160 Bilateral primary osteoarthritis of hip: Secondary | ICD-10-CM | POA: Diagnosis not present

## 2019-07-21 DIAGNOSIS — E119 Type 2 diabetes mellitus without complications: Secondary | ICD-10-CM | POA: Diagnosis not present

## 2019-07-21 DIAGNOSIS — M4628 Osteomyelitis of vertebra, sacral and sacrococcygeal region: Secondary | ICD-10-CM | POA: Diagnosis not present

## 2019-07-21 DIAGNOSIS — I10 Essential (primary) hypertension: Secondary | ICD-10-CM | POA: Diagnosis not present

## 2019-07-22 DIAGNOSIS — E669 Obesity, unspecified: Secondary | ICD-10-CM | POA: Diagnosis not present

## 2019-07-22 DIAGNOSIS — L89154 Pressure ulcer of sacral region, stage 4: Secondary | ICD-10-CM | POA: Diagnosis not present

## 2019-07-22 DIAGNOSIS — Z7409 Other reduced mobility: Secondary | ICD-10-CM | POA: Diagnosis not present

## 2019-07-27 DIAGNOSIS — F331 Major depressive disorder, recurrent, moderate: Secondary | ICD-10-CM | POA: Diagnosis not present

## 2019-07-27 DIAGNOSIS — F4310 Post-traumatic stress disorder, unspecified: Secondary | ICD-10-CM | POA: Diagnosis not present

## 2019-07-28 DIAGNOSIS — M160 Bilateral primary osteoarthritis of hip: Secondary | ICD-10-CM | POA: Diagnosis not present

## 2019-07-28 DIAGNOSIS — E119 Type 2 diabetes mellitus without complications: Secondary | ICD-10-CM | POA: Diagnosis not present

## 2019-07-28 DIAGNOSIS — L89154 Pressure ulcer of sacral region, stage 4: Secondary | ICD-10-CM | POA: Diagnosis not present

## 2019-07-28 DIAGNOSIS — G8929 Other chronic pain: Secondary | ICD-10-CM | POA: Diagnosis not present

## 2019-07-28 DIAGNOSIS — M4628 Osteomyelitis of vertebra, sacral and sacrococcygeal region: Secondary | ICD-10-CM | POA: Diagnosis not present

## 2019-07-28 DIAGNOSIS — I10 Essential (primary) hypertension: Secondary | ICD-10-CM | POA: Diagnosis not present

## 2019-08-05 DIAGNOSIS — M4628 Osteomyelitis of vertebra, sacral and sacrococcygeal region: Secondary | ICD-10-CM | POA: Diagnosis not present

## 2019-08-05 DIAGNOSIS — Z7401 Bed confinement status: Secondary | ICD-10-CM | POA: Diagnosis not present

## 2019-08-05 DIAGNOSIS — L89154 Pressure ulcer of sacral region, stage 4: Secondary | ICD-10-CM | POA: Diagnosis not present

## 2019-08-05 DIAGNOSIS — N050 Unspecified nephritic syndrome with minor glomerular abnormality: Secondary | ICD-10-CM | POA: Diagnosis not present

## 2019-08-05 DIAGNOSIS — I10 Essential (primary) hypertension: Secondary | ICD-10-CM | POA: Diagnosis not present

## 2019-08-05 DIAGNOSIS — G8929 Other chronic pain: Secondary | ICD-10-CM | POA: Diagnosis not present

## 2019-08-05 DIAGNOSIS — D509 Iron deficiency anemia, unspecified: Secondary | ICD-10-CM | POA: Diagnosis not present

## 2019-08-05 DIAGNOSIS — M160 Bilateral primary osteoarthritis of hip: Secondary | ICD-10-CM | POA: Diagnosis not present

## 2019-08-05 DIAGNOSIS — E119 Type 2 diabetes mellitus without complications: Secondary | ICD-10-CM | POA: Diagnosis not present

## 2019-08-05 DIAGNOSIS — I890 Lymphedema, not elsewhere classified: Secondary | ICD-10-CM | POA: Diagnosis not present

## 2019-08-05 DIAGNOSIS — Z96653 Presence of artificial knee joint, bilateral: Secondary | ICD-10-CM | POA: Diagnosis not present

## 2019-08-17 DIAGNOSIS — M4628 Osteomyelitis of vertebra, sacral and sacrococcygeal region: Secondary | ICD-10-CM | POA: Diagnosis not present

## 2019-08-17 DIAGNOSIS — M160 Bilateral primary osteoarthritis of hip: Secondary | ICD-10-CM | POA: Diagnosis not present

## 2019-08-17 DIAGNOSIS — I10 Essential (primary) hypertension: Secondary | ICD-10-CM | POA: Diagnosis not present

## 2019-08-17 DIAGNOSIS — L89154 Pressure ulcer of sacral region, stage 4: Secondary | ICD-10-CM | POA: Diagnosis not present

## 2019-08-17 DIAGNOSIS — E119 Type 2 diabetes mellitus without complications: Secondary | ICD-10-CM | POA: Diagnosis not present

## 2019-08-17 DIAGNOSIS — G8929 Other chronic pain: Secondary | ICD-10-CM | POA: Diagnosis not present

## 2019-08-19 DIAGNOSIS — R600 Localized edema: Secondary | ICD-10-CM | POA: Diagnosis not present

## 2019-08-19 DIAGNOSIS — L600 Ingrowing nail: Secondary | ICD-10-CM | POA: Diagnosis not present

## 2019-08-19 DIAGNOSIS — B351 Tinea unguium: Secondary | ICD-10-CM | POA: Diagnosis not present

## 2019-08-19 DIAGNOSIS — E119 Type 2 diabetes mellitus without complications: Secondary | ICD-10-CM | POA: Diagnosis not present

## 2019-08-25 DIAGNOSIS — E119 Type 2 diabetes mellitus without complications: Secondary | ICD-10-CM | POA: Diagnosis not present

## 2019-08-25 DIAGNOSIS — I10 Essential (primary) hypertension: Secondary | ICD-10-CM | POA: Diagnosis not present

## 2019-08-25 DIAGNOSIS — G8929 Other chronic pain: Secondary | ICD-10-CM | POA: Diagnosis not present

## 2019-08-25 DIAGNOSIS — M4628 Osteomyelitis of vertebra, sacral and sacrococcygeal region: Secondary | ICD-10-CM | POA: Diagnosis not present

## 2019-08-25 DIAGNOSIS — L89154 Pressure ulcer of sacral region, stage 4: Secondary | ICD-10-CM | POA: Diagnosis not present

## 2019-08-25 DIAGNOSIS — M160 Bilateral primary osteoarthritis of hip: Secondary | ICD-10-CM | POA: Diagnosis not present

## 2019-08-26 DIAGNOSIS — L89154 Pressure ulcer of sacral region, stage 4: Secondary | ICD-10-CM | POA: Diagnosis not present

## 2019-08-26 DIAGNOSIS — L89159 Pressure ulcer of sacral region, unspecified stage: Secondary | ICD-10-CM | POA: Diagnosis not present

## 2019-08-26 DIAGNOSIS — Z7401 Bed confinement status: Secondary | ICD-10-CM | POA: Diagnosis not present

## 2019-08-26 DIAGNOSIS — M4628 Osteomyelitis of vertebra, sacral and sacrococcygeal region: Secondary | ICD-10-CM | POA: Diagnosis not present

## 2019-09-01 DIAGNOSIS — M4628 Osteomyelitis of vertebra, sacral and sacrococcygeal region: Secondary | ICD-10-CM | POA: Diagnosis not present

## 2019-09-01 DIAGNOSIS — E119 Type 2 diabetes mellitus without complications: Secondary | ICD-10-CM | POA: Diagnosis not present

## 2019-09-01 DIAGNOSIS — M160 Bilateral primary osteoarthritis of hip: Secondary | ICD-10-CM | POA: Diagnosis not present

## 2019-09-01 DIAGNOSIS — L89154 Pressure ulcer of sacral region, stage 4: Secondary | ICD-10-CM | POA: Diagnosis not present

## 2019-09-01 DIAGNOSIS — I10 Essential (primary) hypertension: Secondary | ICD-10-CM | POA: Diagnosis not present

## 2019-09-01 DIAGNOSIS — G8929 Other chronic pain: Secondary | ICD-10-CM | POA: Diagnosis not present

## 2019-09-04 DIAGNOSIS — Z7401 Bed confinement status: Secondary | ICD-10-CM | POA: Diagnosis not present

## 2019-09-04 DIAGNOSIS — N050 Unspecified nephritic syndrome with minor glomerular abnormality: Secondary | ICD-10-CM | POA: Diagnosis not present

## 2019-09-04 DIAGNOSIS — L89154 Pressure ulcer of sacral region, stage 4: Secondary | ICD-10-CM | POA: Diagnosis not present

## 2019-09-04 DIAGNOSIS — I10 Essential (primary) hypertension: Secondary | ICD-10-CM | POA: Diagnosis not present

## 2019-09-04 DIAGNOSIS — M160 Bilateral primary osteoarthritis of hip: Secondary | ICD-10-CM | POA: Diagnosis not present

## 2019-09-04 DIAGNOSIS — E119 Type 2 diabetes mellitus without complications: Secondary | ICD-10-CM | POA: Diagnosis not present

## 2019-09-04 DIAGNOSIS — I890 Lymphedema, not elsewhere classified: Secondary | ICD-10-CM | POA: Diagnosis not present

## 2019-09-04 DIAGNOSIS — G8929 Other chronic pain: Secondary | ICD-10-CM | POA: Diagnosis not present

## 2019-09-04 DIAGNOSIS — D509 Iron deficiency anemia, unspecified: Secondary | ICD-10-CM | POA: Diagnosis not present

## 2019-09-04 DIAGNOSIS — Z96653 Presence of artificial knee joint, bilateral: Secondary | ICD-10-CM | POA: Diagnosis not present

## 2019-09-06 DIAGNOSIS — M160 Bilateral primary osteoarthritis of hip: Secondary | ICD-10-CM | POA: Diagnosis not present

## 2019-09-06 DIAGNOSIS — I890 Lymphedema, not elsewhere classified: Secondary | ICD-10-CM | POA: Diagnosis not present

## 2019-09-06 DIAGNOSIS — G8929 Other chronic pain: Secondary | ICD-10-CM | POA: Diagnosis not present

## 2019-09-06 DIAGNOSIS — E119 Type 2 diabetes mellitus without complications: Secondary | ICD-10-CM | POA: Diagnosis not present

## 2019-09-06 DIAGNOSIS — L89154 Pressure ulcer of sacral region, stage 4: Secondary | ICD-10-CM | POA: Diagnosis not present

## 2019-09-06 DIAGNOSIS — I10 Essential (primary) hypertension: Secondary | ICD-10-CM | POA: Diagnosis not present

## 2019-09-07 DIAGNOSIS — G8929 Other chronic pain: Secondary | ICD-10-CM | POA: Diagnosis not present

## 2019-09-07 DIAGNOSIS — L89159 Pressure ulcer of sacral region, unspecified stage: Secondary | ICD-10-CM | POA: Diagnosis not present

## 2019-09-17 DIAGNOSIS — M160 Bilateral primary osteoarthritis of hip: Secondary | ICD-10-CM | POA: Diagnosis not present

## 2019-09-17 DIAGNOSIS — L89154 Pressure ulcer of sacral region, stage 4: Secondary | ICD-10-CM | POA: Diagnosis not present

## 2019-09-17 DIAGNOSIS — E119 Type 2 diabetes mellitus without complications: Secondary | ICD-10-CM | POA: Diagnosis not present

## 2019-09-17 DIAGNOSIS — I10 Essential (primary) hypertension: Secondary | ICD-10-CM | POA: Diagnosis not present

## 2019-09-17 DIAGNOSIS — I890 Lymphedema, not elsewhere classified: Secondary | ICD-10-CM | POA: Diagnosis not present

## 2019-09-17 DIAGNOSIS — G8929 Other chronic pain: Secondary | ICD-10-CM | POA: Diagnosis not present

## 2019-09-20 DIAGNOSIS — L89154 Pressure ulcer of sacral region, stage 4: Secondary | ICD-10-CM | POA: Diagnosis not present

## 2019-09-20 DIAGNOSIS — G8929 Other chronic pain: Secondary | ICD-10-CM | POA: Diagnosis not present

## 2019-09-20 DIAGNOSIS — E119 Type 2 diabetes mellitus without complications: Secondary | ICD-10-CM | POA: Diagnosis not present

## 2019-09-20 DIAGNOSIS — I10 Essential (primary) hypertension: Secondary | ICD-10-CM | POA: Diagnosis not present

## 2019-09-20 DIAGNOSIS — I890 Lymphedema, not elsewhere classified: Secondary | ICD-10-CM | POA: Diagnosis not present

## 2019-09-20 DIAGNOSIS — M160 Bilateral primary osteoarthritis of hip: Secondary | ICD-10-CM | POA: Diagnosis not present

## 2019-09-27 DIAGNOSIS — M160 Bilateral primary osteoarthritis of hip: Secondary | ICD-10-CM | POA: Diagnosis not present

## 2019-09-27 DIAGNOSIS — E119 Type 2 diabetes mellitus without complications: Secondary | ICD-10-CM | POA: Diagnosis not present

## 2019-09-27 DIAGNOSIS — G8929 Other chronic pain: Secondary | ICD-10-CM | POA: Diagnosis not present

## 2019-09-27 DIAGNOSIS — L89154 Pressure ulcer of sacral region, stage 4: Secondary | ICD-10-CM | POA: Diagnosis not present

## 2019-09-27 DIAGNOSIS — I10 Essential (primary) hypertension: Secondary | ICD-10-CM | POA: Diagnosis not present

## 2019-09-27 DIAGNOSIS — I890 Lymphedema, not elsewhere classified: Secondary | ICD-10-CM | POA: Diagnosis not present

## 2019-10-04 DIAGNOSIS — N050 Unspecified nephritic syndrome with minor glomerular abnormality: Secondary | ICD-10-CM | POA: Diagnosis not present

## 2019-10-04 DIAGNOSIS — I890 Lymphedema, not elsewhere classified: Secondary | ICD-10-CM | POA: Diagnosis not present

## 2019-10-04 DIAGNOSIS — G8929 Other chronic pain: Secondary | ICD-10-CM | POA: Diagnosis not present

## 2019-10-04 DIAGNOSIS — Z7401 Bed confinement status: Secondary | ICD-10-CM | POA: Diagnosis not present

## 2019-10-04 DIAGNOSIS — D509 Iron deficiency anemia, unspecified: Secondary | ICD-10-CM | POA: Diagnosis not present

## 2019-10-04 DIAGNOSIS — E119 Type 2 diabetes mellitus without complications: Secondary | ICD-10-CM | POA: Diagnosis not present

## 2019-10-04 DIAGNOSIS — M160 Bilateral primary osteoarthritis of hip: Secondary | ICD-10-CM | POA: Diagnosis not present

## 2019-10-04 DIAGNOSIS — I10 Essential (primary) hypertension: Secondary | ICD-10-CM | POA: Diagnosis not present

## 2019-10-04 DIAGNOSIS — L89154 Pressure ulcer of sacral region, stage 4: Secondary | ICD-10-CM | POA: Diagnosis not present

## 2019-10-04 DIAGNOSIS — Z96653 Presence of artificial knee joint, bilateral: Secondary | ICD-10-CM | POA: Diagnosis not present

## 2019-10-05 DIAGNOSIS — I10 Essential (primary) hypertension: Secondary | ICD-10-CM | POA: Diagnosis not present

## 2019-10-05 DIAGNOSIS — E119 Type 2 diabetes mellitus without complications: Secondary | ICD-10-CM | POA: Diagnosis not present

## 2019-10-05 DIAGNOSIS — M160 Bilateral primary osteoarthritis of hip: Secondary | ICD-10-CM | POA: Diagnosis not present

## 2019-10-05 DIAGNOSIS — L89154 Pressure ulcer of sacral region, stage 4: Secondary | ICD-10-CM | POA: Diagnosis not present

## 2019-10-05 DIAGNOSIS — G8929 Other chronic pain: Secondary | ICD-10-CM | POA: Diagnosis not present

## 2019-10-05 DIAGNOSIS — I890 Lymphedema, not elsewhere classified: Secondary | ICD-10-CM | POA: Diagnosis not present

## 2019-10-14 DIAGNOSIS — J309 Allergic rhinitis, unspecified: Secondary | ICD-10-CM | POA: Diagnosis not present

## 2019-10-14 DIAGNOSIS — G8929 Other chronic pain: Secondary | ICD-10-CM | POA: Diagnosis not present

## 2019-10-14 DIAGNOSIS — Z7401 Bed confinement status: Secondary | ICD-10-CM | POA: Diagnosis not present

## 2019-10-14 DIAGNOSIS — M25551 Pain in right hip: Secondary | ICD-10-CM | POA: Diagnosis not present

## 2019-10-14 DIAGNOSIS — M25552 Pain in left hip: Secondary | ICD-10-CM | POA: Diagnosis not present

## 2019-10-14 DIAGNOSIS — E785 Hyperlipidemia, unspecified: Secondary | ICD-10-CM | POA: Diagnosis not present

## 2019-10-14 DIAGNOSIS — R21 Rash and other nonspecific skin eruption: Secondary | ICD-10-CM | POA: Diagnosis not present

## 2019-10-14 DIAGNOSIS — M4628 Osteomyelitis of vertebra, sacral and sacrococcygeal region: Secondary | ICD-10-CM | POA: Diagnosis not present

## 2019-10-14 DIAGNOSIS — I10 Essential (primary) hypertension: Secondary | ICD-10-CM | POA: Diagnosis not present

## 2019-10-14 DIAGNOSIS — M160 Bilateral primary osteoarthritis of hip: Secondary | ICD-10-CM | POA: Diagnosis not present

## 2019-10-14 DIAGNOSIS — I890 Lymphedema, not elsewhere classified: Secondary | ICD-10-CM | POA: Diagnosis not present

## 2019-10-14 DIAGNOSIS — L89154 Pressure ulcer of sacral region, stage 4: Secondary | ICD-10-CM | POA: Diagnosis not present

## 2019-10-20 DIAGNOSIS — L89154 Pressure ulcer of sacral region, stage 4: Secondary | ICD-10-CM | POA: Diagnosis not present

## 2019-10-20 DIAGNOSIS — Z7401 Bed confinement status: Secondary | ICD-10-CM | POA: Diagnosis not present

## 2019-10-20 DIAGNOSIS — Z7409 Other reduced mobility: Secondary | ICD-10-CM | POA: Diagnosis not present

## 2021-01-01 IMAGING — CR DG HIP (WITH OR WITHOUT PELVIS) 3-4V BILAT
5 series · 5 of 5 positions shown · non-contrast
Comparison: None.

CLINICAL DATA: Chronic bilateral hip pain.

EXAM:
DG HIP (WITH OR WITHOUT PELVIS) 3-4V BILAT

[pelvis ap]
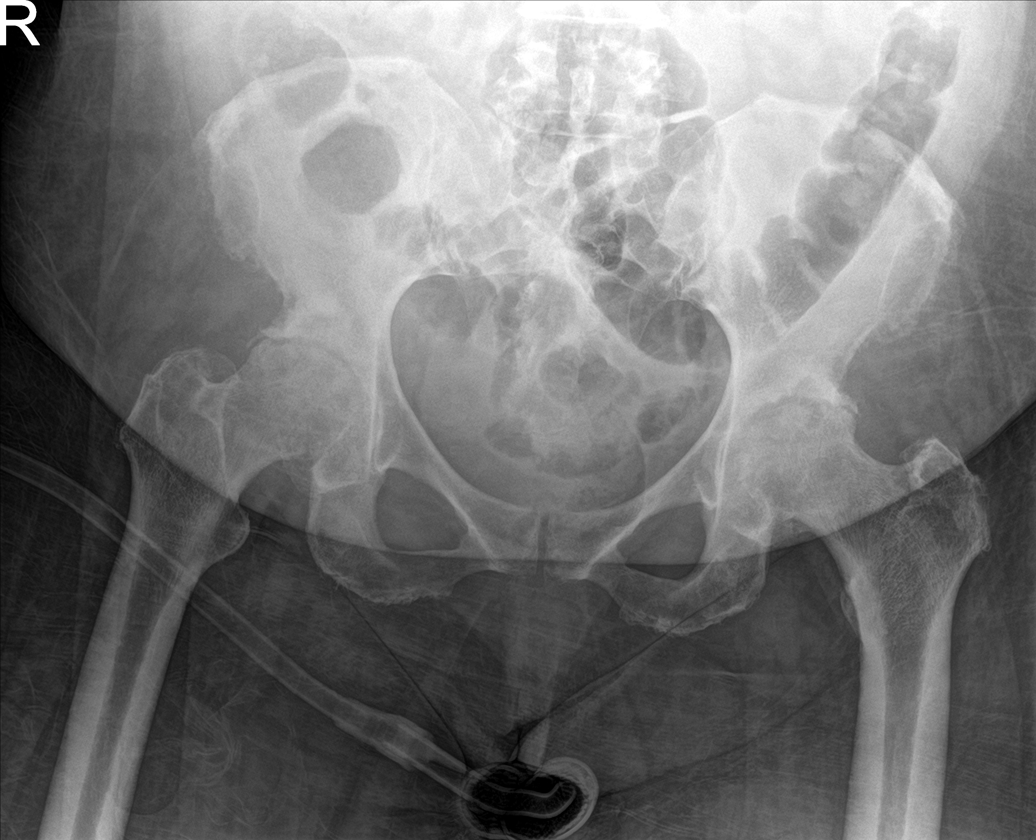

[hip ap (1 of 2)]
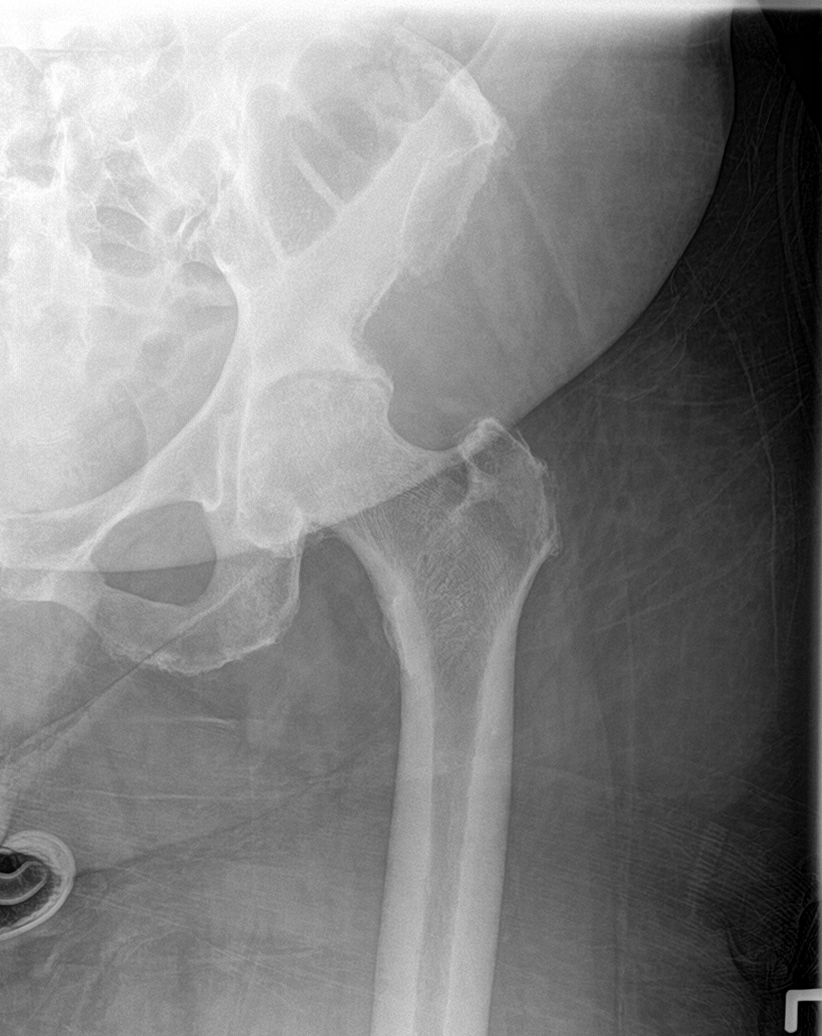

[hip lat (1 of 2)]
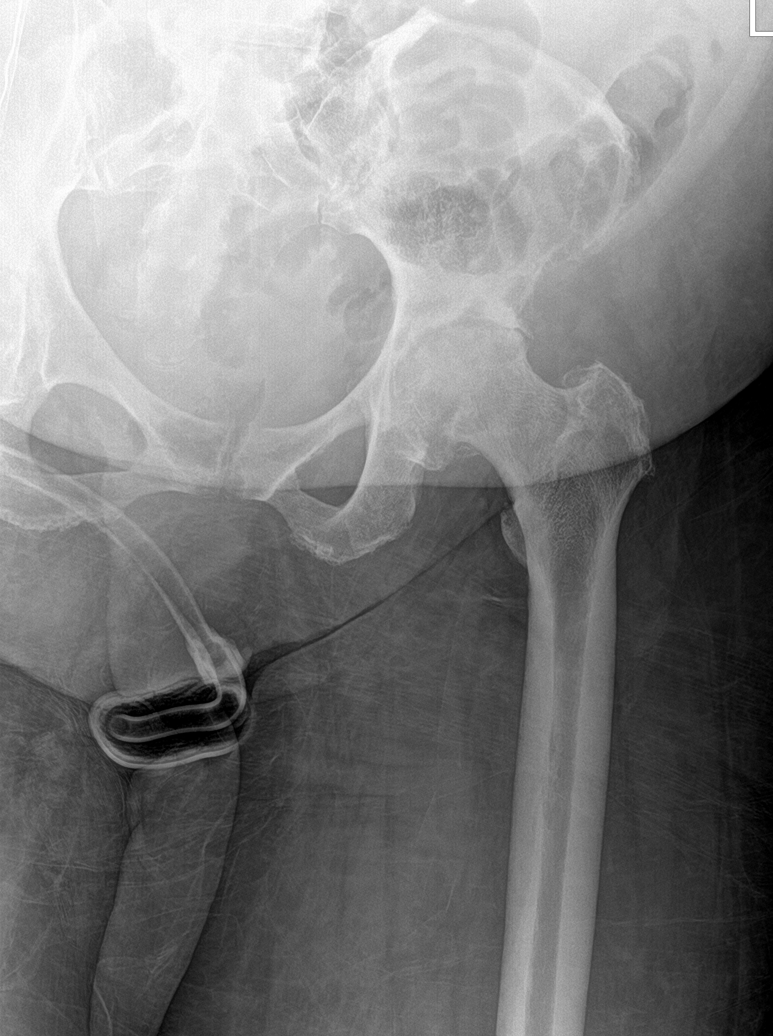

[hip ap (2 of 2)]
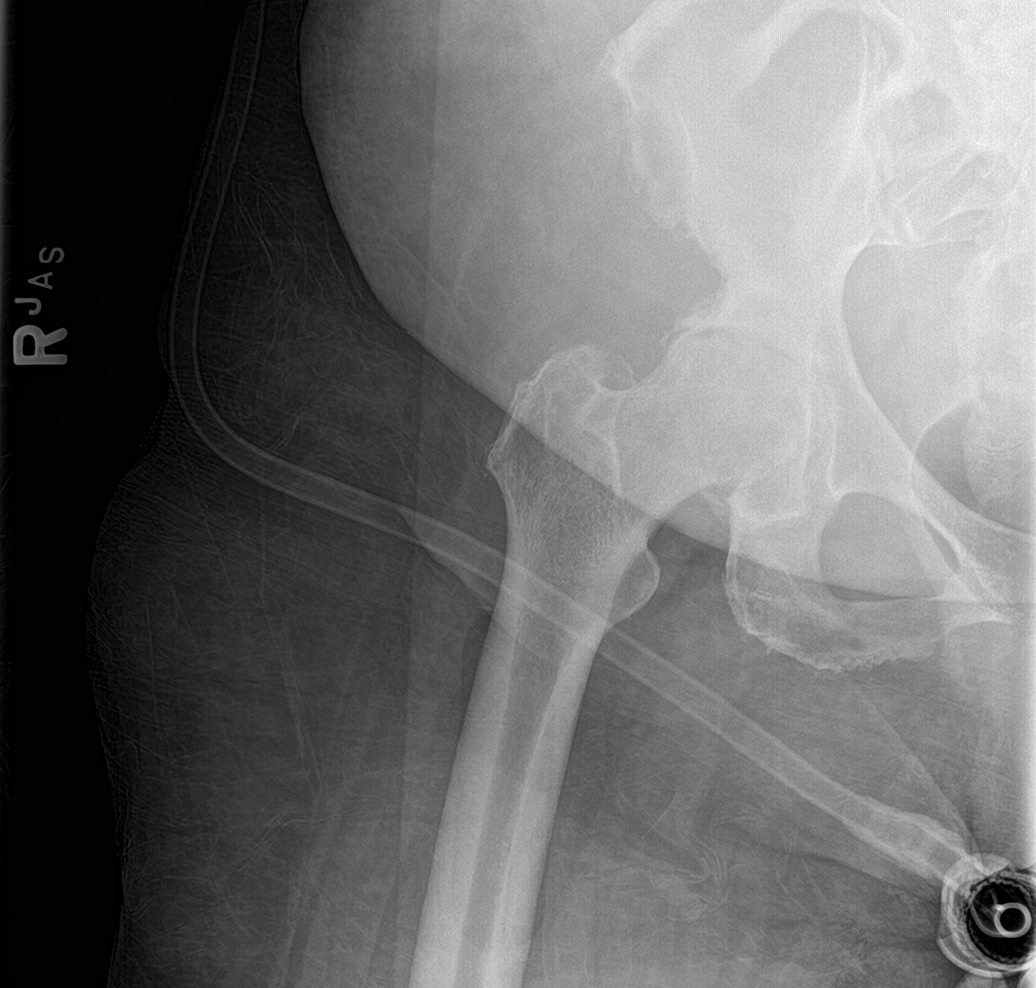

[hip lat (2 of 2)]
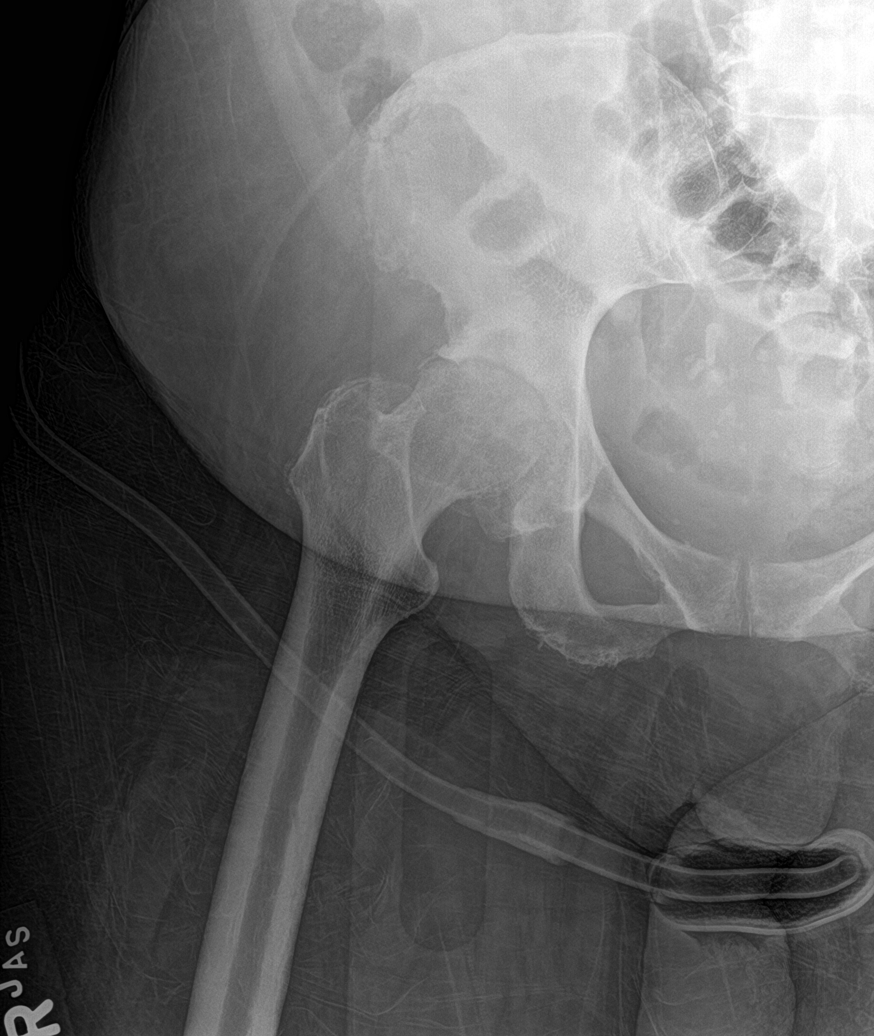

[5 of 5 positions shown; findings below may reference images not displayed]

FINDINGS: Severe osteoarthritis within the hips bilaterally with joint space
loss, osteophyte formation, subchondral sclerosis and cyst
formation. SI joints symmetric and unremarkable. No acute bony
abnormality. Specifically, no fracture, subluxation, or dislocation.
IMPRESSION: Severe bilateral osteoarthritis.  No acute bony abnormality.

## 2022-06-19 ENCOUNTER — Ambulatory Visit
Admit: 2022-06-19 | Discharge: 2022-06-19 | Payer: MEDICARE | Attending: Student in an Organized Health Care Education/Training Program | Primary: Family Medicine

## 2022-06-19 DIAGNOSIS — Z7401 Bed confinement status: Secondary | ICD-10-CM

## 2022-06-19 LAB — AMB POC HEMOGLOBIN A1C: Hemoglobin A1C, POC: 7.4 %

## 2022-06-19 MED ORDER — XTAMPZA ER 9 MG PO C12A
9 MG | Freq: Two times a day (BID) | ORAL | 0 refills | Status: AC | PRN
Start: 2022-06-19 — End: 2022-07-19

## 2022-06-19 MED ORDER — NALOXONE HCL 4 MG/0.1ML NA LIQD
4 | NASAL | 1 refills | Status: AC | PRN
Start: 2022-06-19 — End: ?

## 2022-06-19 MED ORDER — FUROSEMIDE 40 MG PO TABS
40 MG | ORAL_TABLET | Freq: Every day | ORAL | 1 refills | Status: AC
Start: 2022-06-19 — End: ?

## 2022-06-19 MED ORDER — FERROUS SULFATE 325 (65 FE) MG PO TABS
32565 (65 Fe) MG | ORAL_TABLET | Freq: Every day | ORAL | 1 refills | Status: DC
Start: 2022-06-19 — End: 2022-09-27

## 2022-06-19 MED ORDER — EMPAGLIFLOZIN 10 MG PO TABS
10 MG | ORAL_TABLET | Freq: Every day | ORAL | 1 refills | Status: DC
Start: 2022-06-19 — End: 2023-01-09

## 2022-06-19 MED ORDER — OZEMPIC (0.25 OR 0.5 MG/DOSE) 2 MG/1.5ML SC SOPN
21.5 MG/1.5ML | SUBCUTANEOUS | 0 refills | Status: DC
Start: 2022-06-19 — End: 2022-08-09

## 2022-06-19 MED ORDER — GABAPENTIN 600 MG PO TABS
600 MG | ORAL_TABLET | Freq: Three times a day (TID) | ORAL | 1 refills | Status: AC
Start: 2022-06-19 — End: 2022-09-17

## 2022-06-19 MED ORDER — SENNOSIDES-DOCUSATE SODIUM 8.6-50 MG PO TABS
8.6-50 MG | ORAL_TABLET | Freq: Every day | ORAL | 1 refills | Status: DC
Start: 2022-06-19 — End: 2023-01-09

## 2022-06-19 MED ORDER — ATORVASTATIN CALCIUM 80 MG PO TABS
80 MG | ORAL_TABLET | Freq: Every day | ORAL | 1 refills | Status: DC
Start: 2022-06-19 — End: 2023-01-09

## 2022-06-19 MED ORDER — HYDROCORTISONE 2.5 % EX CREA
2.5 % | Freq: Two times a day (BID) | CUTANEOUS | 2 refills | Status: AC
Start: 2022-06-19 — End: 2023-05-13

## 2022-06-19 MED ORDER — MELOXICAM 7.5 MG PO TABS
7.5 MG | ORAL_TABLET | Freq: Every day | ORAL | 1 refills | Status: DC
Start: 2022-06-19 — End: 2023-01-09

## 2022-06-19 MED ORDER — LOSARTAN POTASSIUM 50 MG PO TABS
50 MG | ORAL_TABLET | Freq: Every day | ORAL | 1 refills | Status: DC
Start: 2022-06-19 — End: 2023-01-09

## 2022-06-19 MED ORDER — BACLOFEN 10 MG PO TABS
10 MG | ORAL_TABLET | Freq: Three times a day (TID) | ORAL | 1 refills | Status: DC
Start: 2022-06-19 — End: 2023-01-09

## 2022-06-19 MED ORDER — FLUTICASONE PROPIONATE 50 MCG/ACT NA SUSP
50 MCG/ACT | Freq: Every day | NASAL | 3 refills | Status: AC
Start: 2022-06-19 — End: ?

## 2022-06-19 MED ORDER — IBUPROFEN 800 MG PO TABS
800 MG | ORAL_TABLET | Freq: Three times a day (TID) | ORAL | 1 refills | Status: AC | PRN
Start: 2022-06-19 — End: ?

## 2022-06-19 MED ORDER — AMLODIPINE BESYLATE 10 MG PO TABS
10 MG | ORAL_TABLET | Freq: Every day | ORAL | 1 refills | Status: DC
Start: 2022-06-19 — End: 2022-12-06

## 2022-06-19 MED ORDER — POLYETHYLENE GLYCOL 3350 17 GM/SCOOP PO POWD
17 GM/SCOOP | Freq: Every day | ORAL | 1 refills | Status: AC
Start: 2022-06-19 — End: 2022-07-19

## 2022-06-19 NOTE — Progress Notes (Signed)
Last filled 01/15/2022 01/15/2022 1   Oxycodone Hcl (Ir) 5 Mg Tablet  60.00 30 Ca Dim [id number] ([id number])  01/17/2022 01/15/2022 1   Xtampza Er 9 Mg Capsule  60.00 30 Ca Dim
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
## 2022-06-21 NOTE — Telephone Encounter (Addendum)
Called [facility].  The patient has an account.  Felisa Bonier will need a new order from provider faxed to [phone] to order the supplies.  They will send an additional needed paperwork to provider once the order is received.

## 2022-06-21 NOTE — Telephone Encounter (Signed)
Those supplies is not coming up in the system. Normally they would send over the order. Or you can try to write it on a prescription paper.

## 2022-06-21 NOTE — Telephone Encounter (Signed)
Ostomy and Scientific laboratory technician Strip XL Coloplast  Ref [phone] (30pcs)    Entrust Ostomy Care Odor Eliminator Drops 8 fl oz    Brava - Adhesive Remover Spray Coloplast 1.7 oz  Ref  [id number]    Convatec Wafer Size 1 3/4in 79mm Ref #[id number]    Convatec - stomahesive paste 2 oz   Coloplast - ostomy care paste 2 oz    Convatec pouch 20 pcs  Size 1 3/4 in 62mm Ref# [id number]    BedPads     Underwear XXL ([employer])

## 2022-06-24 NOTE — Telephone Encounter (Signed)
Spoke to cheryl give her the verbal.

## 2022-06-24 NOTE — Telephone Encounter (Signed)
Elnita Maxwell from [facility] is calling to inform the office that the patient's  Physical Therapy evaluation was complete today 06/24/22. [facility] needs a verbal order to complete  Physical Therapy twice a week for the next 4 weeks. Please advise

## 2022-06-25 NOTE — Telephone Encounter (Signed)
Called [facility] [phone] with patient on the line.  Patient ostomy supplies ordered and will be shipped out.  Next order will be Sep 25, 2022.      Patient states she receives her bedpads and underwear from [employer] - [phone].  Will call to place order.

## 2022-06-26 NOTE — Telephone Encounter (Signed)
Called [employer] [phone]) and was advised that patient's [place] Medicaid must show as inactive before they can move forward with ordering the supplies under [facility] Medicaid.  Patient to call to cancel [place] Medicaid, then patient to call [employer] to order supplies.  Then [employer] will send order form to PCP for review and signature.  Provided [employer] with provider's information.    Called patient and advised of above.  Patient stated she called [place] Medicaid a few days ago and they are in the process of cancelling the [facility] Medicaid - patient is waiting for email confirmation of this.  Advised patient to contact [employer] when she has the confirmation to place her order.

## 2022-07-02 NOTE — Telephone Encounter (Signed)
Please advise.

## 2022-07-02 NOTE — Telephone Encounter (Signed)
Patients home health nurse is calling to advise patient is requesting a refill of: oxyCODONE ER (XTAMPZA ER) 9 MG C12A    [facility]

## 2022-07-03 NOTE — Telephone Encounter (Signed)
Last filled 06/19/2022 06/19/2022 1   Xtampza Er 9 Mg Capsule  60.00 30 [place]  Pt can not get filled    lvm

## 2022-07-04 ENCOUNTER — Encounter: Payer: MEDICARE | Attending: Family | Primary: Family Medicine

## 2022-07-04 NOTE — Telephone Encounter (Signed)
This patient called back regarding her medication refill needs. Please call back to discus further.

## 2022-07-08 NOTE — Telephone Encounter (Signed)
Casey Dudley from [facility] is stating the pt wants to put physical therapy on hold until she finds out about her Xray results.

## 2022-07-11 NOTE — Telephone Encounter (Signed)
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 Please fill out and fax back

## 2022-07-11 NOTE — Telephone Encounter (Signed)
Spoke to pt informed her we didn't receive a form. She will call them and give them our fax number again.

## 2022-07-16 NOTE — Telephone Encounter (Signed)
Being handled on another message.

## 2022-07-16 NOTE — Telephone Encounter (Signed)
Did you get a form for this pt for transportation for her appts?

## 2022-07-16 NOTE — Telephone Encounter (Signed)
Patient has Orthopedic appt Friday.     [employer] transportation company needs call back from Dr Laverta Woodstock to complete necessity forms for patient to be picked up    [phone]   [employer]     Please assist.

## 2022-07-16 NOTE — Telephone Encounter (Signed)
Patients Daughter faxed this form over last Friday.  Does this need to be faxed again

## 2022-07-16 NOTE — Telephone Encounter (Signed)
[email or web address] [facility] called to let you know the patient is being discharged. Patients nursing goals were met.

## 2022-07-17 NOTE — Telephone Encounter (Signed)
Patient is calling back because she says the form needs to be printed off their website [email or web address]. Says it needs to be faxed back to [phone]    If you have any questions please call [phone]

## 2022-07-17 NOTE — Telephone Encounter (Signed)
Formed filled out and waiting to be sign.

## 2022-07-18 NOTE — Telephone Encounter (Signed)
Formed signed and faxed.

## 2022-07-19 ENCOUNTER — Ambulatory Visit: Admit: 2022-07-19 | Payer: MEDICARE | Primary: Family Medicine

## 2022-07-19 ENCOUNTER — Ambulatory Visit: Admit: 2022-07-19 | Discharge: 2022-07-19 | Payer: MEDICARE | Attending: Orthopaedic Surgery | Primary: Family Medicine

## 2022-07-19 DIAGNOSIS — M25552 Pain in left hip: Secondary | ICD-10-CM

## 2022-07-19 NOTE — Progress Notes (Signed)
[name], M.D.  Hip and Knee Replacement Surgery  Phone [phone]  Fax [phone]      [facility] [address]  [address]  [address]  [place] [address]  Dept: [phone]      Name: Casey Dudley  DOB: 09-07-1956  MRN: [id number]  Date of Service: 07/19/2022      Subjective:   Reason for Visit:    Hip Pain (Bil hip pain patient has not walked in 3 years )      History of Present Illness:   Casey Dudley  is a 66 y.o. female who presents for evaluation of the bilateralhip.  Duration years.  Pain severity severe.  Pain is worse when lying on side.  The patient has previously been getting care at [facility].  She has had bilateral hip intra-articular injections.  Unfortunately she has been nonambulatory for 3 years.  She presents today on a stretcher.  She has significant bilateral lower extremity equinus contractures.  She also has a ostomy.    Review of Systems   Past Medical History:   Diagnosis Date    Hypertension     Osteoarthritis     Type 2 diabetes mellitus without complication (HCC)      Past Surgical History:   Procedure Laterality Date    SKIN GRAFT      lower upper arm both    TOTAL KNEE ARTHROPLASTY Bilateral     TUBAL LIGATION  1992     Social History     Socioeconomic History    Marital status: Single     Spouse name: Not on file    Number of children: Not on file    Years of education: Not on file    Highest education level: Not on file   Occupational History    Not on file   Tobacco Use    Smoking status: Never    Smokeless tobacco: Never   Substance and Sexual Activity    Alcohol use: Never    Drug use: Not on file    Sexual activity: Not on file   Other Topics Concern    Not on file   Social History Narrative    Not on file     Social Determinants of Health     Financial Resource Strain: Not on file   Food Insecurity: Not on file   Transportation Needs: Not on file   Physical Activity: Not on file   Stress: Not on file   Social Connections: Not on  file   Intimate Partner Violence: Not on file   Housing Stability: Not on file      Family History   Problem Relation Age of Onset    Diabetes Mother     Hypertension Father      Allergies   Allergen Reactions    Morphine        Objective:   LMP  (LMP Unknown)      Constitutional: Well developed and well nourished.  Neurological: Alert and oriented x 3.  Psychiatric: No mood disorder and calm affect.  Cardiovascular/Pulmonary: Compartments are soft and well perfused.  Breathing room air comfortably.  Sensation: Gross sensation intact to light touch unless noted below.  Skin: Clean, dry and intact on the exposed skin surfaces, other than noted below.    The patient is nonambulatory and on the stretcher.  The patient has significant bilateral equinus contracture.  She has limited strength in bilateral lower  extremities.    When evaluating the right hip, skin is intact without any evidence of trauma or infection, her right hip is effectively fused.    When evaluating the left hip, skin is intact without any evidence of trauma or infection, her left hip is effectively fused.        XRAYS:  XR HIP BILATERAL W AP PELVIS (2 VIEWS)  AP pelvis and lateral view of the bilateral hip were obtained today.    These demonstrates severe arthritis of bilateral hips.  There is complete   loss of the joint space.  There is effectively bilateral hip fusions   because of the severe arthritis.      Assessment:     1. Left hip pain    2. Right hip pain    3. Arthritis of left hip    4. Arthritis of right hip         Plan:          Patient is a pleasant 66 year old female with severe bilateral hip arthritis.  She effectively has bilateral hip fusions because of the severity of her arthritis.  Unfortunately she has been nonambulatory for 3 years.  She is on a stretcher today.  I explained to her the treatment for severe arthritis of his hip replacement however I do not think hip replacement would provide her much pain relief as her hips are  already effectively fused.  I also believe that she is high risk for surgery.  She has been nonambulatory for 3 years now and has developed substantial bilateral equinus contractures.  Even if I were to perform hip replacements she would need to have these addressed to be able to ambulate appropriately.  She also has significant deconditioning.  I do not think the benefits of hip replacement warranted the substantial risk.  I will make a referral to [facility] to see if they have different opinion.  She can follow-up with me on as-needed basis.           Electronically signed by [name], M.D. 07/19/2022 at 12:07 PM.

## 2022-07-25 NOTE — Telephone Encounter (Signed)
FYI

## 2022-07-25 NOTE — Telephone Encounter (Signed)
Cheryl @[facility] is calling to let the office know that the patient is discharging herself from [facility].

## 2022-07-26 NOTE — Telephone Encounter (Signed)
Patient returning missed call

## 2022-07-26 NOTE — Telephone Encounter (Signed)
Patient called requesting to have a stretcher necessity form be filled out for transportation to and from the doctor. Patient states the form can be filled out online at [email or web address]. patient states the form needs to say she need to be transported by a stretcher. Please assist

## 2022-07-29 NOTE — Telephone Encounter (Signed)
Alison Murray is out of the office will have her complete when she returns.

## 2022-07-29 NOTE — Telephone Encounter (Addendum)
Gar Ponto UR RN  From [place] checking on status of   Form needing signature faxed to office on   [facility] # [phone]  "Certification for medical necessity for Non ER stretcher transportation"  form faxed 07/19/22   also on  9/5, 7/31  Attention to [employer] # [phone]    The form faxed 07/18/22 is an incorrect form    Please confirm fax receipt

## 2022-07-29 NOTE — Telephone Encounter (Signed)
Already spoke to pt form  his been resubmitted.

## 2022-07-31 NOTE — Telephone Encounter (Signed)
Paperwork completed and faxed.

## 2022-08-02 LAB — HEMOGLOBIN A1C: Hemoglobin A1C: 7.2 % — ABNORMAL HIGH

## 2022-08-02 NOTE — Telephone Encounter (Signed)
FYI

## 2022-08-02 NOTE — Telephone Encounter (Signed)
Please notify patient once completed and sent because patient needs her incontinence supplies.
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
## 2022-08-02 NOTE — Telephone Encounter (Signed)
Casey Dudley will be faxing over another form([employer] connections form)        Please make sure every single question is answered on the form       Please call if you have any questions are concerns         If someone else besides Casey Dudley fills out the form please provide the npi number and also the full printed name

## 2022-08-06 ENCOUNTER — Encounter

## 2022-08-06 NOTE — Telephone Encounter (Signed)
Chris from [employer] in regards to the form, being refill out and fax over. Please assist.        Fax: [phone]

## 2022-08-06 NOTE — Telephone Encounter (Signed)
Needs med refill for:    Semaglutide,0.25 or 0.5MG /DOS, (OZEMPIC, 0.25 OR 0.5 MG/DOSE,) 2 MG/1.5ML SOPN using once weekly

## 2022-08-06 NOTE — Telephone Encounter (Signed)
Casey Dudley has form she will complete form.

## 2022-08-06 NOTE — Telephone Encounter (Signed)
Rx pend if approve.

## 2022-08-07 ENCOUNTER — Encounter

## 2022-08-07 NOTE — Telephone Encounter (Signed)
[employer] calling regarding the incontinence order they received from the office. There is a section listed comments, pads, liners, and wipes need to be added to that line, the physician needs to sign and date . Please fax to [phone]

## 2022-08-07 NOTE — Progress Notes (Signed)
Formatting of this note is different from the original.  Subjective:     Patient ID: Casey Dudley is a 66 y.o. female    Referring Provider: Mickel Crow, NP    Chief Complaint:   Chief Complaint   Patient presents with   ? Right Hip - Pain   ? Left Hip - Pain     History of Present Illness    Casey Dudley, a new patient, is a 66 y.o. female who arrives today to be evaluated for bilateral hip pain.    Onset/Inciting Event: Chronic, no mechanism of injury  Location: global  Quality: sharp, dull, throbbing, numb, tingling  Radiation: back to gluteal region with paresthesia bilaterally   Severity: ranging from 5/10 to 9/10  Timing: 4 years  Aggravating: movement  Alleviating: rest,   Associated symptoms: paresthesia in bilateral gluteal area.  Of note, the patient has had persistently worsening bilateral hip pain with ankylosis on her imaging that has led to her being relegated to bed rest over the last year.  She is currently undergoing physical therapy but they are unable to get her into hip extension and as such she has not been ambulating or even transferring to wheelchair or seated position.    Prior Procedures:  none    Pain Medications:   Oxycodone 5mg  and Xtampza 9mg , Gabapentin 600mg  TID, Baclofen 10mg  daily    Other Modalities Tried:  Previous intra-articular cortisone injections with [facility] bilaterally, last injections were 1 year ago. Patient felt significant relief with injections lasting 3 months. She has tried physical therapy, but has not performed in the last 3 weeks due to back pain. Patient intends to continue with PT beginning next week.     ODI:  72%    I have reviewed the ROS, past family history, past medical history, social history, and past surgical history as documented with no changes except as noted above. Medications reviewed with the patient.    Objective:     Visit Vitals  Vitals:    08/07/22 0912   BP: 140/71   Pulse: 112   SpO2: 96%     Hip Musculoskeletal Exam    Strength     Right      Flexion: 5/5.       Adduction: 5/5.       Abduction: 5/5.     Left      Flexion: 5/5.       Adduction: 5/5.       Abduction: 5/5.     Special Tests    Left      Log roll test: positive    General      Constitutional: appears stated age    Scleral icterus: no    Labored breathing: no    Psychiatric: normal mood and affect and no acute distress    Neurological: alert and oriented x3    Skin: intact    Lymphadenopathy: none     Exam was limited due to the patient being in a stretcher and being unable to get up    Imaging: Radiographs of bilateral hips from 08/02/22 was independently reviewed and interpreted and shows bilateral ankylosis of the hips    Assessment:     1. Primary osteoarthritis of both hips       2. Ankylosis of both hips  Amb referral to Rheumatology         Casey Dudley is a 66 y.o. female presenting with bilateral hip pain due to osteoarthritis that  has previously improved with steroid injections.    Plan:     ? Return to clinic for bilateral intra-articular hip steroid injection. Procedure risks and expectations were discussed and patient expressed understanding and is willing to proceed with our plan. Specific risks include leading, infection, nerve injury.  ? Based on history and physical examination, a referral to rheumatology was ordered to further evaluate the patient's pain symptoms and to investigate possible etiology of her bilateral ankylosed hips.  ? We will refer the patient to Dr. Jaclyn Shaggy when her template is available for consideration for admission to inpatient rehabilitation for comprehensive exercise program to assist with transferring to wheelchair and/or beginning a walking program.    I, Cedric Fishman, SCAT, ATC on  08/07/22  acted as a Education administrator for DR. AMEET NAGPAL, MD and documented the history, exam, and plan.      Casey Dudley was seen and examined by me personally today in the office. I agree with the findings and plan as documented above by  Cedric Fishman, ATC who  authored the original version of the note. Changes above reflect my personal collection of history and exam and assessment and plan modified as appropriate.    I agree with the note as documented including the diagnosis, clinical exam and plan of care.    Casey Heft, MD   Electronically signed by Ameet Mackey Birchwood, MD at 08/07/2022 11:56 AM EDT

## 2022-08-08 NOTE — Telephone Encounter (Signed)
Paperwork was already completed.

## 2022-08-09 MED ORDER — OZEMPIC (0.25 OR 0.5 MG/DOSE) 2 MG/1.5ML SC SOPN
2 MG/1.5ML | SUBCUTANEOUS | 0 refills | Status: AC
Start: 2022-08-09 — End: 2022-09-27

## 2022-08-09 NOTE — Telephone Encounter (Signed)
Pt said she has been doing good on 0.25mg  and is willing to titrate up to 0.5mg 

## 2022-08-09 NOTE — Telephone Encounter (Signed)
Patients daughter came into the office to see if form was completed. Advised her that the form was completed and faxed twice . Gave her a copy of the form

## 2022-08-13 NOTE — Telephone Encounter (Signed)
Added and faxed.

## 2022-08-13 NOTE — Telephone Encounter (Signed)
[employer] order also needs to include wipes and panty liners Fax [phone]

## 2022-09-03 NOTE — Telephone Encounter (Signed)
In need of refill    Medication:    amLODIPine (NORVASC) 10 MG tablet     Any changes in meds since last visit: No      Pharmacy: [id number] - [place], [place]: 8.9.23  NOV: 11.17.23

## 2022-09-03 NOTE — Telephone Encounter (Signed)
Spoke to pt informed her she has refills at the pharmacy she can call them to refill.

## 2022-09-06 NOTE — Telephone Encounter (Signed)
Formatting of this note might be different from the original.  Reminder attempt made to pt about appointment on 09/09/2022 at 11:00. Pt did not answer and voicemail was left with call back number [phone].  Electronically signed by Viona Gilmore at 09/06/2022  2:55 PM EDT
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
## 2022-09-09 NOTE — Patient Instructions (Signed)
Formatting of this note is different from the original.  [facility] Patient Instructions     Post-procedure Expectations  You will be discharged to home with your driver.  The affected arm or leg may feel temporary numbness, tingling or warmth.  You were given a number of medicines during the procedure. These may include sedatives, local anesthetics, steroids and other medicines. Any of these drugs, or the procedure itself, may cause side effects (drowsiness, temporary numbness, weakness and soreness).   You may feel temporary numbness, weakness or tingling:   In the neck, upper back, arm, or fingertips (if your procedure was done in your neck)   In the lower back, buttocks, or legs (if your procedure was done in your lower back)  What should I do when I get home?  Rest for a few hours.   Walk with assistance as long as you are numb, weak, or drowsy.Avoid driving until numbness, weakness or drowsiness wears off.   Resume activities as tolerated; do not overdo it.   Perform low-impact exercise initially and advance as tolerated.  Resume your regular diet.  Follow your doctor?s instructions about going back to work. Often patients return to work the same or next day.   Other self-care instructions  Take your medicines as usual. If you stopped taking your blood-thinning medicine, check with your doctor about taking this again. Most can be restarted 24 hours after your procedure.  You may apply ice to the site as needed. Or, apply heat if ice is intolerable. Stretching and gentle massage may help muscle soreness and spasm.  The injection may cause increased pain for 1 to 2 days. Use any medications you have been prescribed for pain or any over the counter medication you can tolerate.   You may alternate doses of Tylenol and Advil/Motrin or Tylenol and Aleve/Naprosyn.    Do not take these medicines together at the same time.    Alternating medicines                    Example:  Tylenol   (acetaminophen)  [phone] mg*    Max 3000 mg per day  Every 8 hours        8 am             4 pm        12 midnight   Advil/Motrin   (ibuprofen)    OR   Aleve/Naprosyn  (naproxen) 200-600 mg    250-500 mg Every 8 hours    Every 12 hours 4 am       12 noon        8 pm    OR          10 am                         10 pm     If your injection contained a steroid - the steroid medication may take up to 7 days to start relieving pain.   You may remove the bandage after several hours. You may bathe or shower the same day. A small bruise and tenderness at the injection site are normal for 1 to 2 days. Avoid hot tubs or whirlpool tubs with jet sprays for 72 hours.   If you have diabetes, your blood sugar may increase for several days. Call your primary doctor if the blood sugar levels concern you. Monitor your blood sugar closely for the  first week (or longer if needed).  CALL us IMMEDIATELY, DIAL 911 OR GO TO THE NEAREST EMERGENCY ROOM IF YOU HAVE ANY OF THE FOLLOWING:  Clinic phone # [phone]  a lot of  bleeding or bruising   chills that won?t stop, or fever over 101.3 F  pus-like drainage from the injection site    severe swelling, redness, hardness or warmth at the injection site  lots of  pressure in your neck or back  new symptoms such as   trouble with bladder or bowel function or control   unable to move arms or legs  major change in the pattern or level of your pain  severe headache that does not go away with usual treatment  allergic reaction symptoms such as   shortness of breath  wheezing  difficulty swallowing  diffuse rash or hives  swelling of the lips or tongue    Electronically signed by Jonna Coup, RN at 09/09/2022 11:21 AM EDT

## 2022-09-09 NOTE — Progress Notes (Signed)
Formatting of this note might be different from the original.  Procedure: Bilateral hip joint injection    The patient was placed prone on the fluoroscopic table. The skin over the left anterior hip was prepped with chlorhexidine and draped in the usual sterile fashion.  Fluoroscopy was used to identify the femoral head, femoral neck, and acetabulum.  A skin wheal was made using 2% lidocaine. A 22 gauge 3.5 inch Quinke needle was inserted through the skin and advanced slowly until the tip of the needle contacted the neck of the femur medial to the greater trochanter, using direct fluoroscopic visualization.  After negative aspiration, 2.67mL of Omnipaque 240mg /mL was injected and confirmed intra-articular placement under fluoroscopy.  After negative aspiration, 20mg /0.45mL Kenalog and 74mL 0.5% bupivacaine was injected slowly and incrementally.  The needle was withdrawn after flushing.  A Band-Aid was applied. The procedure was repeated exactly as listed above for the right hip joint.    Pain Pre-Procedure: 5/10   Pain Post-Procedure: 0/10    Latricia Heft, MD      Electronically signed by Ameet Mackey Birchwood, MD at 09/09/2022 12:51 PM EDT

## 2022-09-27 ENCOUNTER — Ambulatory Visit: Admit: 2022-09-27 | Discharge: 2022-09-27 | Payer: MEDICARE | Attending: Family Medicine | Primary: Family Medicine

## 2022-09-27 DIAGNOSIS — E118 Type 2 diabetes mellitus with unspecified complications: Secondary | ICD-10-CM

## 2022-09-27 MED ORDER — FERROUS SULFATE 325 (65 FE) MG PO TBEC
325 (65 Fe) MG | ORAL_TABLET | Freq: Every day | ORAL | 1 refills | Status: AC
Start: 2022-09-27 — End: ?

## 2022-09-27 MED ORDER — VITAMIN D (CHOLECALCIFEROL) 25 MCG (1000 UT) PO CAPS
25 MCG (1000 UT) | ORAL_CAPSULE | Freq: Every day | ORAL | 3 refills | Status: AC
Start: 2022-09-27 — End: ?

## 2022-09-27 MED ORDER — SEMAGLUTIDE (1 MG/DOSE) 4 MG/3ML SC SOPN
43 MG/3ML | SUBCUTANEOUS | 0 refills | Status: DC
Start: 2022-09-27 — End: 2022-10-14

## 2022-09-27 NOTE — Progress Notes (Signed)
Casey Dudley (DOB:  16-Apr-1956) is a 66 y.o. female here for evaluation of the following chief complaint(s):  No chief complaint on file.    SUBJECTIVE:  HPI  66 year old female seen in clinic for follow-up.  States that she has been seen by [facility] then referred to [facility]opedics and [facility].  She recently had hip injections done by physical medicine about 2 weeks ago.  She has noted some improvement in her hip pain, has been able to lay on her sides for longer than she was previously.  She is requesting a refill of her vitamin D and iron supplementation.  She would also like to discuss increasing her Ozempic.    Review of Systems  See HPI for details    Past Medical History:   Diagnosis Date    Hypertension     Osteoarthritis     Type 2 diabetes mellitus without complication ([place])       Family History   Problem Relation Age of Onset    Diabetes Mother     Hypertension Father       Social History     Socioeconomic History    Marital status: Single     Spouse name: Not on file    Number of children: Not on file    Years of education: Not on file    Highest education level: Not on file   Occupational History    Not on file   Tobacco Use    Smoking status: Never    Smokeless tobacco: Never   Substance and Sexual Activity    Alcohol use: Never    Drug use: Not on file    Sexual activity: Not on file   Other Topics Concern    Not on file   Social History Narrative    Not on file     Social Determinants of Health     Financial Resource Strain: Not on file   Food Insecurity: Not on file   Transportation Needs: Not on file   Physical Activity: Not on file   Stress: Not on file   Social Connections: Not on file   Intimate Partner Violence: Not on file   Housing Stability: Not on file      Past Surgical History:   Procedure Laterality Date    SKIN GRAFT      lower upper arm both    TOTAL KNEE ARTHROPLASTY Bilateral     TUBAL LIGATION  1992        OBJECTIVE:  Physical Exam  BP 132/70   Pulse (!) 119   Ht 1.626 m  (5\' 4" )   Wt 86.2 kg (190 lb)   LMP  (LMP Unknown)   SpO2 95%   BMI 32.61 kg/m   General Appearance:  Well appearing, developed and nourished.  Appears stated age.  No acute distress.  Head:  Normocephalic, atraumatic.  Nose:  Nares patent, no lesions.  Skin:  No rashes noted.  Musculoskeletal:  Bedridden and stretcher  Neurologic:  Grossly non focal.  Psych:  Cognitive function intact.  Judgement and insight normal.  Mood and affect appropriate.    No results found for any visits on 09/27/22.  ASSESSMENT/PLAN:  1. Type 2 diabetes mellitus with complication [facility])  Assessment & Plan:  Will increase Ozempic to 1 mg once a week and follow-up in 3 months with repeat lab work.  Pneumococcal vaccine given in clinic today.  We will also place referral to podiatry as patient  is unable to do foot care for herself due to her bedridden status.   Orders:  -     Hemoglobin A1C; Future  -     Basic Metabolic Panel; Future  -     Semaglutide, 1 MG/DOSE, 4 MG/3ML SOPN; Inject 1 mg into the skin once a week, Disp-9 mL, R-0Normal  -     Microalbumin / Creatinine Urine Ratio; Future  -     Lipid Panel; Future  -     [facility], DPM, Podiatry, [facility] M[facility] [address]  2. Iron deficiency anemia, unspecified iron deficiency anemia type  -     ferrous sulfate (FE TABS 325) 325 (65 Fe) MG EC tablet; Take 1 tablet by mouth daily (with breakfast), Disp-90 tablet, R-1Normal  -     Cbc With Auto Differential; Future  -     Ferritin; Future  -     Iron And TIBC; Future  3. Vitamin D deficiency  Assessment & Plan:  Restart vitamin D supplementation 1000 international units daily and follow-up in 3 months with repeat lab work   Orders:  -     Vitamin D, Cholecalciferol, 25 MCG (1000 UT) CAPS; Take 1,000 Units by mouth daily, Disp-90 capsule, R-3Normal  -     Vitamin D 25 Hydroxy; Future  4. Immunization due  -     Influenza, FLUARIX, (age 56 mo+),  IM, PF, 0.5 mL  -     Pneumococcal, PCV20, PREVNAR 20, (age 56w+), IM, PF  5. Ankylosis of  hip, unspecified laterality  Assessment & Plan:  [facility] orthopedic notes reviewed, [facility] orthopedic notes reviewed, and [facility] physical medicine notes reviewed.  She recently had bilateral hip injections by [facility].  Does note that there has been some improvement and able to lay on her sides for longer periods of time without pain.  She is post follow-up with them in a couple of months.  There was mention in the notes about possible inpatient rehab to increase her mobility and advised the patient to contact the physical medicine rehab office to assess the status of that referral.     Patient is bedridden and states that she has not been able to get a mammogram done since 2016 because she cannot stand.  We will find out if we can do ultrasounds for breast cancer screening.  She was also told she cannot do a colonoscopy but Cologuard may be a good option for her to obtain some colon cancer screening.  Will address at her follow-up visit.  Return in about 3 months (around 12/28/2022) for medicare AWV.    An electronic signature was used to authenticate this note.  Wynonia Lawman, DO

## 2022-09-27 NOTE — Assessment & Plan Note (Signed)
Restart vitamin D supplementation 1000 international units daily and follow-up in 3 months with repeat lab work

## 2022-09-27 NOTE — Assessment & Plan Note (Signed)
[facility] orthopedic notes reviewed, [facility] orthopedic notes reviewed, and [facility] physical medicine notes reviewed.  She recently had bilateral hip injections by [facility] medicine.  Does note that there has been some improvement and able to lay on her sides for longer periods of time without pain.  She is post follow-up with them in a couple of months.  There was mention in the notes about possible inpatient rehab to increase her mobility and advised the patient to contact the physical medicine rehab office to assess the status of that referral.

## 2022-09-27 NOTE — Assessment & Plan Note (Signed)
Will increase Ozempic to 1 mg once a week and follow-up in 3 months with repeat lab work.  Pneumococcal vaccine given in clinic today.  We will also place referral to podiatry as patient is unable to do foot care for herself due to her bedridden status.

## 2022-10-08 ENCOUNTER — Encounter

## 2022-10-08 MED ORDER — OXYCODONE HCL 5 MG PO TABS
5 MG | ORAL_TABLET | Freq: Two times a day (BID) | ORAL | 0 refills | Status: AC | PRN
Start: 2022-10-08 — End: 2022-11-07

## 2022-10-08 NOTE — Telephone Encounter (Signed)
Spoke to pt informed her she has refills at the pharmacy. Please send in oxy if ok.

## 2022-10-08 NOTE — Telephone Encounter (Signed)
Patient is requesting refills on all medications  meloxicam (MOBIC) 7.5 MG tablet  losartan (COZAAR) 50 MG tablet  hydrocortisone 2.5 % cream  gabapentin (NEURONTIN) 600 MG tablet  fluticasone (FLONASE) 50 MCG/ACT nasal spray  ferrous sulfate (FE TABS 325) 325 (65 Fe) MG EC tablet  empagliflozin (JARDIANCE) 10 MG tablet  baclofen (LIORESAL) 10 MG tablet  atorvastatin (LIPITOR) 80 MG tablet  Ascorbic Acid 500 MG CAPS  amLODIPine (NORVASC) 10 MG tablet  Vitamin D, Cholecalciferol, 25 MCG (1000 UT) CAPS  senna-docusate (PERICOLACE) 8.6-50 MG per tablet  Semaglutide, 1 MG/DOSE, 4 MG/3ML SOPN  Postassium 50 mg Tablet  oxyCODONE (ROXICODONE) 5 MG immediate release tablet    LV-09/27/22  NV-Not Scheduled yet      [id number]  [phone]

## 2022-10-11 NOTE — Telephone Encounter (Signed)
Patient is calling back stating that she needs the refill for the Ozempic to be called in because she is due to take it. Patient is out of the [place]. Please assist

## 2022-10-14 MED ORDER — SEMAGLUTIDE (1 MG/DOSE) 4 MG/3ML SC SOPN
43 MG/3ML | SUBCUTANEOUS | 0 refills | Status: DC
Start: 2022-10-14 — End: 2023-01-09

## 2022-10-14 NOTE — Telephone Encounter (Signed)
Rx pend. Per pt the pharmacy never got the rx

## 2022-10-14 NOTE — Telephone Encounter (Signed)
MEDICATION WAS ESCRIBED ON 09/27/22.  PER PROTOCOL/NURSES DISCRETION SENT TO PRACTICE FOR PROVIDER DISCRETION.

## 2022-10-14 NOTE — Telephone Encounter (Signed)
The pt is calling back about her RX for ozempic she needs her shot [facility] [place]  [facility]

## 2022-10-16 ENCOUNTER — Telehealth

## 2022-10-16 NOTE — Telephone Encounter (Signed)
Patient stated she has been unsuccessful at finding a pharmacy that has ozempic in stock. Please advise.

## 2022-10-16 NOTE — Telephone Encounter (Signed)
Pt already spoke to pharmacy she said [facility] on [address] has it and they will get it transferred from her pharmacy for her.

## 2022-10-17 ENCOUNTER — Encounter

## 2022-10-25 ENCOUNTER — Telehealth

## 2022-10-25 ENCOUNTER — Encounter

## 2022-10-25 NOTE — Telephone Encounter (Signed)
Patient request refill for    Miralax powder    [id number]    Please read previous message in regards to sending ultrasound mammogram due to patient is bedridden.

## 2022-10-25 NOTE — Telephone Encounter (Signed)
Mingo Amber from [facility] is calling stating that patient has order for mammogram but patient mentioned she is bed bound and [facility] was supposed to send an order for an ultrasound. Order has not been sent yet

## 2022-10-28 MED ORDER — POLYETHYLENE GLYCOL 3350 17 GM/SCOOP PO POWD
17 GM/SCOOP | Freq: Every day | ORAL | 1 refills | Status: DC | PRN
Start: 2022-10-28 — End: 2023-01-09

## 2022-11-25 ENCOUNTER — Ambulatory Visit: Payer: MEDICARE | Primary: Family Medicine

## 2022-12-06 ENCOUNTER — Telehealth

## 2022-12-06 ENCOUNTER — Encounter

## 2022-12-06 MED ORDER — OXYCODONE HCL 5 MG PO TABS
5 MG | ORAL_TABLET | Freq: Two times a day (BID) | ORAL | 0 refills | Status: AC | PRN
Start: 2022-12-06 — End: 2023-02-04

## 2022-12-06 MED ORDER — AMLODIPINE BESYLATE 10 MG PO TABS
10 MG | ORAL_TABLET | Freq: Every day | ORAL | 1 refills | Status: AC
Start: 2022-12-06 — End: 2023-06-12

## 2022-12-06 NOTE — Telephone Encounter (Signed)
Patient is requesting a refill for the following medication    amLODIPine (NORVASC) 10 MG tablet Take 1 tablet by mouth daily 30 day supply       oxyCODONE (ROXICODONE) 5 MG immediate release tablet Take 1 tablet by mouth 2 times daily as needed for Pain for up to 30 days. Max Daily Amount: 10 mg      No changes to medication    Ph[facility]acy    [id number] [place] [place]      Last visit:09/27/2022  Next Visit: 01/08/2023

## 2022-12-06 NOTE — Telephone Encounter (Signed)
AMLODIPINE - Per protocol criteria not met. Labs > 6 months. Sent to practice for provider discretion.   OXYCODONE - Medication is controlled substance, no protocol, sent to office to fill

## 2022-12-06 NOTE — Telephone Encounter (Signed)
Formatting of this note is different from the original.  Unable to Schedule   If new or worsening symptoms, use ?Symptom Call for Nurse? Reason & Smartext.  As applicable, check MyChart Status and follow clinics specific  protocols     Information obtained from Caller:  What is the reason for scheduling/re-scheduling appointment?  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  When is next available appointment?  If this is an appointment change request, what is date of existing appointment?      Electronically signed by Marshia Ly at 12/06/2022 10:12 AM EST

## 2023-01-08 ENCOUNTER — Encounter: Payer: MEDICAID | Attending: Student in an Organized Health Care Education/Training Program | Primary: Family Medicine

## 2023-01-09 ENCOUNTER — Telehealth: Admit: 2023-01-09 | Discharge: 2023-01-09 | Payer: MEDICARE | Attending: Family Medicine | Primary: Family Medicine

## 2023-01-09 DIAGNOSIS — E118 Type 2 diabetes mellitus with unspecified complications: Secondary | ICD-10-CM

## 2023-01-09 MED ORDER — GABAPENTIN 600 MG PO TABS
600 | ORAL_TABLET | Freq: Three times a day (TID) | ORAL | 1 refills | Status: AC
Start: 2023-01-09 — End: 2023-07-08

## 2023-01-09 MED ORDER — BACLOFEN 10 MG PO TABS
10 | ORAL_TABLET | Freq: Three times a day (TID) | ORAL | 1 refills | Status: AC
Start: 2023-01-09 — End: ?

## 2023-01-09 MED ORDER — SEMAGLUTIDE (1 MG/DOSE) 4 MG/3ML SC SOPN
4 | SUBCUTANEOUS | 0 refills | Status: AC
Start: 2023-01-09 — End: ?

## 2023-01-09 MED ORDER — ATORVASTATIN CALCIUM 80 MG PO TABS
80 | ORAL_TABLET | Freq: Every day | ORAL | 1 refills | Status: AC
Start: 2023-01-09 — End: ?

## 2023-01-09 MED ORDER — SENNOSIDES-DOCUSATE SODIUM 8.6-50 MG PO TABS
ORAL_TABLET | Freq: Every day | ORAL | 1 refills | Status: AC
Start: 2023-01-09 — End: ?

## 2023-01-09 MED ORDER — OXYCODONE ER 9 MG PO C12A
9 | Freq: Two times a day (BID) | ORAL | 0 refills | Status: AC
Start: 2023-01-09 — End: 2023-02-08

## 2023-01-09 MED ORDER — EMPAGLIFLOZIN 10 MG PO TABS
10 | ORAL_TABLET | Freq: Every day | ORAL | 1 refills | Status: AC
Start: 2023-01-09 — End: ?

## 2023-01-09 MED ORDER — LOSARTAN POTASSIUM 50 MG PO TABS
50 | ORAL_TABLET | Freq: Every day | ORAL | 1 refills | Status: AC
Start: 2023-01-09 — End: ?

## 2023-01-09 MED ORDER — MELOXICAM 7.5 MG PO TABS
7.5 | ORAL_TABLET | Freq: Every day | ORAL | 1 refills | Status: AC
Start: 2023-01-09 — End: ?

## 2023-01-09 MED ORDER — POLYETHYLENE GLYCOL 3350 17 GM/SCOOP PO POWD
17 | Freq: Every day | ORAL | 1 refills | Status: AC | PRN
Start: 2023-01-09 — End: 2023-07-08

## 2023-01-09 NOTE — Assessment & Plan Note (Signed)
Patient still due for lab work, reminded to complete.  Empagliflozin and Ozempic refilled today.

## 2023-01-09 NOTE — Assessment & Plan Note (Signed)
Chronic pain syndrome.  Uses immediate and long-acting oxycodone.  PDMP reviewed, no red flags or abnormalities.

## 2023-01-09 NOTE — Assessment & Plan Note (Signed)
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

## 2023-01-09 NOTE — Assessment & Plan Note (Signed)
Uses Mobic daily and oxycodone.  PDMP reviewed, no red flags or abnormalities.

## 2023-01-09 NOTE — Assessment & Plan Note (Signed)
Lipid panel order active, patient reminded to complete.  Atorvastatin 80 mg refilled today

## 2023-01-09 NOTE — Assessment & Plan Note (Signed)
MiraLAX and senna refilled today

## 2023-01-09 NOTE — Assessment & Plan Note (Signed)
Needs refill of losartan

## 2023-01-09 NOTE — Progress Notes (Signed)
Casey Dudley was evaluated through a synchronous (real-time) Audio and Video encounter. Patient identification was verified at the start of the visit. She (or guardian if applicable) is aware that this is a billable service, which includes applicable co-pays. This visit was conducted with the patient's (and/or legal guardian's) verbal consent.  The patient was located at Home: [place] [address].  The provider was located at [facility] (Appt Dept): [address] [place] [address].    Casey Dudley (DOB:  July 23, 1956) is a 67 y.o. female here for evaluation of the following chief complaint(s):  Follow-up (6 month follow up)    SUBJECTIVE:  HPI  67 year old female seen via virtual visit for follow-up.  Needs refills of several different medications.  States that she has been stable on all of her meds.    Review of Systems  See HPI for details    Past Medical History:   Diagnosis Date    Hypertension     Osteoarthritis     Type 2 diabetes mellitus without complication ([place])       Family History   Problem Relation Age of Onset    Diabetes Mother     Hypertension Father       Social History     Socioeconomic History    Marital status: Single     Spouse name: Not on file    Number of children: Not on file    Years of education: Not on file    Highest education level: Not on file   Occupational History    Not on file   Tobacco Use    Smoking status: Never    Smokeless tobacco: Never   Substance and Sexual Activity    Alcohol use: Never    Drug use: Not on file    Sexual activity: Not on file   Other Topics Concern    Not on file   Social History Narrative    Not on file     Social Determinants of Health     Financial Resource Strain: Not on file   Food Insecurity: Not on file   Transportation Needs: Not on file   Physical Activity: Not on file   Stress: Not on file   Social Connections: Not on file   Intimate Partner Violence: Not on file   Housing Stability: Not on file      Past  Surgical History:   Procedure Laterality Date    SKIN GRAFT      lower upper arm both    TOTAL KNEE ARTHROPLASTY Bilateral     TUBAL LIGATION  1992        OBJECTIVE:  Physical Exam  '@AHEAVIRTUAL'$ @  ASSESSMENT/PLAN:  1. Type 2 diabetes mellitus with complication [facility])  Assessment & Plan:  Patient still due for lab work, reminded to complete.  Empagliflozin and Ozempic refilled today.    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    3. Primary hypertension  Assessment & Plan:  Needs refill of losartan   Orders:  -     losartan (COZAAR) 50 MG tablet; Take 1 tablet by mouth daily, Disp-100  tablet, R-1Normal  4. Pain of both hip joints  Assessment & Plan:  Uses Mobic daily and oxycodone.  PDMP reviewed, no red flags or abnormalities.    Orders:  -     gabapentin (NEURONTIN) 600 MG tablet; Take 1 tablet by mouth 3 times daily for 180 days., Disp-300 tablet, R-1Normal  -     baclofen (LIORESAL) 10 MG tablet; Take 1 tablet by mouth 3 times daily, Disp-300 tablet, R-1Normal  -     meloxicam (MOBIC) 7.5 MG tablet; Take 1 tablet by mouth daily, Disp-90 tablet, R-1Normal  5. Mixed hyperlipidemia  Assessment & Plan:  Lipid panel order active, patient reminded to complete.  Atorvastatin 80 mg refilled today   Orders:  -     atorvastatin (LIPITOR) 80 MG tablet; Take 1 tablet by mouth daily, Disp-100 tablet, R-1Normal  6. Pain syndrome, chronic  Assessment & Plan:  Chronic pain syndrome.  Uses immediate and long-acting oxycodone.  PDMP reviewed, no red flags or abnormalities.    Orders:  -     oxyCODONE ER 9 MG C12A; Take 9 mg by mouth in the morning and at bedtime for 30 days. Max Daily Amount: 18 mg, Disp-60 each, R-0Normal  7. Constipation due to opioid  therapy  Assessment & Plan:  MiraLAX and senna refilled today   Orders:  -     polyethylene glycol (GLYCOLAX) 17 GM/SCOOP powder; Take 17 g by mouth daily as needed (constipation), Disp-1530 g, R-1Normal  -     senna-docusate (PERICOLACE) 8.6-50 MG per tablet; Take 1 tablet by mouth daily, Disp-90 tablet, R-1Normal    On this date 01/09/2023 I have spent 15 minutes reviewing previous notes, test results and face to face (virtual) with the patient discussing the diagnosis and importance of compliance with the treatment plan as well as documenting on the day of the visit.    Return in about 6 months (around 07/10/2023) for medicare AWV.    An electronic signature was used to authenticate this note.  Casey Batty, DO

## 2023-01-10 ENCOUNTER — Telehealth

## 2023-01-10 NOTE — Telephone Encounter (Signed)
Patient is requesting a refill for the following medication      Semaglutide, 1 MG/DOSE, 4 MG/3ML SOPN 1 mg into the skin once a week     No changes to medication    [place], [place]    Last visit:01/09/2023  Next Visit:none            Patient is requesting a refill for the following medication    oxyCODONE ER 9 MG C12A Take 9 mg by mouth in the morning and at bedtime for 30 days. Max Daily Amount: 18 mg   Notes:  Reduce doses taken as pain becomes manageable      No changes to medication    [id number] [place]

## 2023-01-10 NOTE — Telephone Encounter (Signed)
[place] ON 2/29. DISP. 9ML   OXYCODONE - Medication is controlled substance, no protocol, sent to office to fill

## 2023-01-13 NOTE — Telephone Encounter (Signed)
Approved today  Request Reference Number: [id number]. XTAMPZA ER CAP '9MG'$  is approved through 02/12/2023. Your patient may now fill this prescription and it will be covered.

## 2023-01-13 NOTE — Telephone Encounter (Signed)
PA started on covermymeds- Key: [id number]

## 2023-01-13 NOTE — Telephone Encounter (Signed)
patient is calling regarding oxyCODONE ER 9 MG C12A. the pharmacy told her insurance needs a diagnosis or reason the medication is being prescribed in order for them to cover. please update and send to pharmacy on file

## 2023-01-14 NOTE — Telephone Encounter (Signed)
Patient stated that the pharmacy is requesting for a new prescription to be sent over for the oxyCODONE ER 9 MG C12A. Please assist

## 2023-01-14 NOTE — Telephone Encounter (Signed)
Spoke to pharmacy they wasn't running it, she reran it and it went through she will get ready for pt.

## 2023-02-11 NOTE — Telephone Encounter (Signed)
[facility] Medicare annual wellness visit project: please call patient to schedule an appointment for an [place] appointment.

## 2023-02-17 ENCOUNTER — Encounter

## 2023-02-17 NOTE — Telephone Encounter (Signed)
Per protocol, medication exclusion, sent to office for provider discretion.

## 2023-02-17 NOTE — Telephone Encounter (Signed)
Medication(s):  oxyCODONE (ROXICODONE) 5 MG immediate release tablet  2x a day    30 day supply    Has there been any Rx changes since last visit: No      [facility]:   [id number] [place], [place] - [address] - P [phone]    Last visit: 01/09/23  Next visit: 07/15/23

## 2023-02-18 NOTE — Telephone Encounter (Signed)
Please advise 

## 2023-02-20 MED ORDER — OXYCODONE HCL 5 MG PO TABS
5 MG | ORAL_TABLET | Freq: Two times a day (BID) | ORAL | 0 refills | Status: AC | PRN
Start: 2023-02-20 — End: 2023-04-21

## 2023-03-10 ENCOUNTER — Encounter

## 2023-03-10 NOTE — Telephone Encounter (Signed)
xyCODONE (ROXICODONE) 5 MG immediate release tablet   Taken twice per day    [id number]    No medication changes     LOV 01/09/23  NOV 07/15/23

## 2023-03-10 NOTE — Telephone Encounter (Signed)
Medication is controlled substance, no protocol, sent to office to fill.

## 2023-03-11 MED ORDER — OXYCODONE HCL 5 MG PO TABS
5 | ORAL_TABLET | Freq: Two times a day (BID) | ORAL | 0 refills | Status: DC | PRN
Start: 2023-03-11 — End: 2023-03-31

## 2023-03-11 NOTE — Telephone Encounter (Signed)
Please send rx

## 2023-03-31 ENCOUNTER — Encounter

## 2023-03-31 NOTE — Telephone Encounter (Signed)
Rx pend

## 2023-03-31 NOTE — Telephone Encounter (Signed)
Patient request refill for    Oxycodone 5 mg tablet, twice a day as needed for pain, 60 ds    No med changes  [phone]

## 2023-03-31 NOTE — Telephone Encounter (Signed)
Per protocol, medication exclusion, sent to office for provider discretion.

## 2023-04-01 MED ORDER — OXYCODONE HCL 5 MG PO TABS
5 | ORAL_TABLET | Freq: Two times a day (BID) | ORAL | 0 refills | Status: DC | PRN
Start: 2023-04-01 — End: 2023-04-04

## 2023-04-02 ENCOUNTER — Encounter

## 2023-04-04 ENCOUNTER — Encounter

## 2023-04-04 MED ORDER — OXYCODONE HCL 5 MG PO TABS
5 | ORAL_TABLET | Freq: Two times a day (BID) | ORAL | 0 refills | Status: AC | PRN
Start: 2023-04-04 — End: 2023-05-04

## 2023-04-04 NOTE — Telephone Encounter (Signed)
Patient stated that per the pharmacy a new prescription for a 30 Day Supply of the   oxyCODONE (ROXICODONE) 5 MG immediate release tablet   not a 60 Day Supply. Please send    [id number]  [phone]

## 2023-04-04 NOTE — Telephone Encounter (Signed)
Rx pend for 30 day per pt.

## 2023-04-12 ENCOUNTER — Encounter

## 2023-04-15 NOTE — Telephone Encounter (Signed)
Our records show you are late for your LOS and ATO medication refill. Please call your pharmacy to request this medicine as soon as possible. If you need refills, please call [facility]     If you have picked up your medicine already or this medicine is discontinued, please disregard.      We appreciate you partnering with [place] for your healthcare needs.

## 2023-05-13 ENCOUNTER — Telehealth

## 2023-05-13 MED ORDER — HYDROCORTISONE 2.5 % EX CREA
2.5 % | Freq: Two times a day (BID) | CUTANEOUS | 1 refills | Status: AC
Start: 2023-05-13 — End: ?

## 2023-05-13 MED ORDER — OXYCODONE HCL 5 MG PO TABS
5 MG | ORAL_TABLET | Freq: Four times a day (QID) | ORAL | 0 refills | Status: AC | PRN
Start: 2023-05-13 — End: 2023-06-12

## 2023-05-13 NOTE — Telephone Encounter (Signed)
hydrocortisone 2.5 % cream:Apply topically 2 times daily Apply topically 2 times daily     oxyCODONE (ROXICODONE) 5 MG immediate release tablet:1 tablet by mouth 2 times daily as needed for Pain for up to 60 days. Intended supply: 7 days. Take lowest dose possible to manage pain Max Daily Amount: 10 mg

## 2023-05-13 NOTE — Telephone Encounter (Signed)
Hydrocortisone cream was sent in today. oxyCODONE 5mg  is pended

## 2023-05-13 NOTE — Telephone Encounter (Signed)
Per protocol criteria met for refill. Refill sent per physician ordered protocol. See signed orders.    OXYCODONE - Medication is controlled substance, no protocol, sent to office to fill.

## 2023-06-04 ENCOUNTER — Encounter

## 2023-06-06 ENCOUNTER — Encounter

## 2023-06-12 ENCOUNTER — Encounter

## 2023-06-12 MED ORDER — AMLODIPINE BESYLATE 10 MG PO TABS
10 MG | ORAL_TABLET | Freq: Every day | ORAL | 1 refills | Status: AC
Start: 2023-06-12 — End: ?

## 2023-06-12 NOTE — Telephone Encounter (Signed)
amLODIPine (NORVASC) 10 MG tablet   Taken once per day    [id number]    No medication changes     LOV 01/09/23  NOV 07/15/23

## 2023-06-12 NOTE — Telephone Encounter (Signed)
Per protocol criteria not met. Labs > 6 months. Sending to practice for provider discretion.

## 2023-06-12 NOTE — Telephone Encounter (Signed)
Rx loaded

## 2023-06-17 NOTE — Telephone Encounter (Signed)
Brett Canales from [employer] is calling in regards to patients incomplete incontinence form. He will be re faxing the form along with the information he needs to have completed

## 2023-06-17 NOTE — Telephone Encounter (Signed)
Noted  

## 2023-06-23 NOTE — Telephone Encounter (Signed)
Entered A1c from [facility]

## 2023-06-27 ENCOUNTER — Encounter

## 2023-07-15 ENCOUNTER — Encounter: Attending: Family Medicine

## 2023-07-15 NOTE — Progress Notes (Deleted)
This is a [facility] managed patient that needs an updated A1c completed.

## 2023-07-19 ENCOUNTER — Encounter

## 2023-08-01 LAB — HEMOGLOBIN A1C
Hemoglobin A1C, External: 6.6 % — ABNORMAL HIGH (ref 4.3–5.6)
Hemoglobin A1C: 6.6 %

## 2023-08-21 ENCOUNTER — Encounter

## 2023-09-06 ENCOUNTER — Encounter

## 2023-09-24 NOTE — Telephone Encounter (Signed)
 Entered A1c from [facility]

## 2023-10-03 ENCOUNTER — Encounter: Admit: 2023-10-03 | Admitting: Family Medicine

## 2023-10-03 DIAGNOSIS — M25551 Pain in right hip: Principal | ICD-10-CM

## 2023-10-03 DIAGNOSIS — M25552 Pain in left hip: Secondary | ICD-10-CM

## 2023-11-25 ENCOUNTER — Encounter

## 2024-04-08 ENCOUNTER — Encounter

## 2024-05-11 NOTE — Telephone Encounter (Signed)
 Will wait on new fax. Pt has not been seen in over a year. Pt will need to make an appt when paperwork arrive.

## 2024-05-11 NOTE — Telephone Encounter (Signed)
 [employer] called to check on status of fax sent about medical supplies for patient. Stated fax originally sent back on 5/16. Will refax documents to be returned.  Fax  for documents [phone]. Please follow up and assist

## 2024-05-13 NOTE — Telephone Encounter (Signed)
 Spoke to pt she is seeing someone else now for this, she will call them and have them fax it there.

## 2024-06-05 ENCOUNTER — Encounter
